# Patient Record
Sex: Male | Born: 1970 | Race: White | Hispanic: No | Marital: Married | Smoking: Former smoker
Health system: Southern US, Community
[De-identification: ages and names within clinical notes are randomized; demographics above are authoritative.]

## PROBLEM LIST (undated history)

## (undated) DIAGNOSIS — K5790 Diverticulosis of intestine, part unspecified, without perforation or abscess without bleeding: Secondary | ICD-10-CM

## (undated) DIAGNOSIS — K529 Noninfective gastroenteritis and colitis, unspecified: Secondary | ICD-10-CM

## (undated) DIAGNOSIS — N529 Male erectile dysfunction, unspecified: Secondary | ICD-10-CM

## (undated) DIAGNOSIS — S2239XA Fracture of one rib, unspecified side, initial encounter for closed fracture: Secondary | ICD-10-CM

## (undated) DIAGNOSIS — K760 Fatty (change of) liver, not elsewhere classified: Secondary | ICD-10-CM

## (undated) DIAGNOSIS — R739 Hyperglycemia, unspecified: Secondary | ICD-10-CM

## (undated) DIAGNOSIS — K2970 Gastritis, unspecified, without bleeding: Secondary | ICD-10-CM

## (undated) DIAGNOSIS — G4730 Sleep apnea, unspecified: Secondary | ICD-10-CM

## (undated) DIAGNOSIS — E785 Hyperlipidemia, unspecified: Secondary | ICD-10-CM

## (undated) DIAGNOSIS — E349 Endocrine disorder, unspecified: Secondary | ICD-10-CM

## (undated) DIAGNOSIS — K219 Gastro-esophageal reflux disease without esophagitis: Secondary | ICD-10-CM

## (undated) HISTORY — PX: KNEE ARTHROSCOPY: SHX127

## (undated) HISTORY — PX: ESOPHAGOGASTRODUODENOSCOPY: SHX1529

## (undated) HISTORY — PX: COLONOSCOPY: SHX174

## (undated) HISTORY — PX: HERNIA REPAIR: SHX51

---

## 2005-01-24 ENCOUNTER — Emergency Department: Payer: Self-pay | Admitting: Emergency Medicine

## 2009-12-30 ENCOUNTER — Ambulatory Visit: Payer: Self-pay

## 2014-06-14 ENCOUNTER — Ambulatory Visit: Payer: Self-pay | Admitting: Gastroenterology

## 2014-06-18 LAB — PATHOLOGY REPORT

## 2014-08-16 ENCOUNTER — Ambulatory Visit: Payer: Self-pay | Admitting: Gastroenterology

## 2014-09-10 ENCOUNTER — Ambulatory Visit: Payer: Self-pay | Admitting: Gastroenterology

## 2014-09-13 ENCOUNTER — Ambulatory Visit: Payer: Self-pay | Admitting: Gastroenterology

## 2015-01-20 LAB — SURGICAL PATHOLOGY

## 2015-09-30 DIAGNOSIS — K760 Fatty (change of) liver, not elsewhere classified: Secondary | ICD-10-CM | POA: Diagnosis not present

## 2015-09-30 DIAGNOSIS — K219 Gastro-esophageal reflux disease without esophagitis: Secondary | ICD-10-CM | POA: Diagnosis not present

## 2015-09-30 DIAGNOSIS — R739 Hyperglycemia, unspecified: Secondary | ICD-10-CM | POA: Diagnosis not present

## 2015-09-30 DIAGNOSIS — E781 Pure hyperglyceridemia: Secondary | ICD-10-CM | POA: Diagnosis not present

## 2015-10-24 DIAGNOSIS — Z125 Encounter for screening for malignant neoplasm of prostate: Secondary | ICD-10-CM | POA: Diagnosis not present

## 2015-10-24 DIAGNOSIS — E291 Testicular hypofunction: Secondary | ICD-10-CM | POA: Diagnosis not present

## 2015-10-24 DIAGNOSIS — R739 Hyperglycemia, unspecified: Secondary | ICD-10-CM | POA: Diagnosis not present

## 2015-10-30 DIAGNOSIS — E291 Testicular hypofunction: Secondary | ICD-10-CM | POA: Diagnosis not present

## 2015-10-30 DIAGNOSIS — K219 Gastro-esophageal reflux disease without esophagitis: Secondary | ICD-10-CM | POA: Diagnosis not present

## 2015-10-30 DIAGNOSIS — E781 Pure hyperglyceridemia: Secondary | ICD-10-CM | POA: Diagnosis not present

## 2015-10-30 DIAGNOSIS — R739 Hyperglycemia, unspecified: Secondary | ICD-10-CM | POA: Diagnosis not present

## 2015-11-12 DIAGNOSIS — E291 Testicular hypofunction: Secondary | ICD-10-CM | POA: Diagnosis not present

## 2016-01-19 DIAGNOSIS — E291 Testicular hypofunction: Secondary | ICD-10-CM | POA: Diagnosis not present

## 2016-01-19 DIAGNOSIS — Z6841 Body Mass Index (BMI) 40.0 and over, adult: Secondary | ICD-10-CM | POA: Diagnosis not present

## 2016-01-19 DIAGNOSIS — E781 Pure hyperglyceridemia: Secondary | ICD-10-CM | POA: Diagnosis not present

## 2016-01-19 DIAGNOSIS — R739 Hyperglycemia, unspecified: Secondary | ICD-10-CM | POA: Diagnosis not present

## 2016-01-27 DIAGNOSIS — E291 Testicular hypofunction: Secondary | ICD-10-CM | POA: Diagnosis not present

## 2016-01-27 DIAGNOSIS — R739 Hyperglycemia, unspecified: Secondary | ICD-10-CM | POA: Diagnosis not present

## 2016-01-27 DIAGNOSIS — E781 Pure hyperglyceridemia: Secondary | ICD-10-CM | POA: Diagnosis not present

## 2016-01-27 DIAGNOSIS — K760 Fatty (change of) liver, not elsewhere classified: Secondary | ICD-10-CM | POA: Diagnosis not present

## 2016-02-18 DIAGNOSIS — M25562 Pain in left knee: Secondary | ICD-10-CM | POA: Diagnosis not present

## 2016-02-18 DIAGNOSIS — M2392 Unspecified internal derangement of left knee: Secondary | ICD-10-CM | POA: Diagnosis not present

## 2016-03-15 ENCOUNTER — Other Ambulatory Visit: Payer: Self-pay | Admitting: Orthopedic Surgery

## 2016-03-15 DIAGNOSIS — M2392 Unspecified internal derangement of left knee: Secondary | ICD-10-CM

## 2016-03-15 DIAGNOSIS — M25562 Pain in left knee: Secondary | ICD-10-CM

## 2016-04-07 ENCOUNTER — Ambulatory Visit
Admission: RE | Admit: 2016-04-07 | Discharge: 2016-04-07 | Disposition: A | Discharge status: Home / Self Care | Payer: 59 | Source: Ambulatory Visit | Attending: Orthopedic Surgery | Admitting: Orthopedic Surgery

## 2016-04-07 DIAGNOSIS — M2392 Unspecified internal derangement of left knee: Secondary | ICD-10-CM

## 2016-04-07 DIAGNOSIS — M25562 Pain in left knee: Secondary | ICD-10-CM | POA: Diagnosis not present

## 2016-04-07 DIAGNOSIS — X58XXXA Exposure to other specified factors, initial encounter: Secondary | ICD-10-CM | POA: Diagnosis not present

## 2016-04-07 DIAGNOSIS — S83242A Other tear of medial meniscus, current injury, left knee, initial encounter: Secondary | ICD-10-CM | POA: Insufficient documentation

## 2016-04-07 DIAGNOSIS — M94262 Chondromalacia, left knee: Secondary | ICD-10-CM | POA: Insufficient documentation

## 2016-04-19 DIAGNOSIS — M1732 Unilateral post-traumatic osteoarthritis, left knee: Secondary | ICD-10-CM | POA: Diagnosis not present

## 2016-04-19 DIAGNOSIS — S83232A Complex tear of medial meniscus, current injury, left knee, initial encounter: Secondary | ICD-10-CM | POA: Diagnosis not present

## 2016-05-03 ENCOUNTER — Encounter
Admission: RE | Admit: 2016-05-03 | Discharge: 2016-05-03 | Disposition: A | Discharge status: Home / Self Care | Payer: 59 | Source: Ambulatory Visit | Attending: Surgery | Admitting: Surgery

## 2016-05-03 HISTORY — DX: Gastritis, unspecified, without bleeding: K29.70

## 2016-05-03 HISTORY — DX: Endocrine disorder, unspecified: E34.9

## 2016-05-03 HISTORY — DX: Diverticulosis of intestine, part unspecified, without perforation or abscess without bleeding: K57.90

## 2016-05-03 HISTORY — DX: Fracture of one rib, unspecified side, initial encounter for closed fracture: S22.39XA

## 2016-05-03 HISTORY — DX: Hyperglycemia, unspecified: R73.9

## 2016-05-03 HISTORY — DX: Noninfective gastroenteritis and colitis, unspecified: K52.9

## 2016-05-03 HISTORY — DX: Hyperlipidemia, unspecified: E78.5

## 2016-05-03 HISTORY — DX: Fatty (change of) liver, not elsewhere classified: K76.0

## 2016-05-03 HISTORY — DX: Gastro-esophageal reflux disease without esophagitis: K21.9

## 2016-05-03 HISTORY — DX: Male erectile dysfunction, unspecified: N52.9

## 2016-05-03 NOTE — Patient Instructions (Addendum)
  Your procedure is scheduled on: 05-11-16 Report to Same Day Surgery 2nd floor medical mall To find out your arrival time please call [phone] between 1PM - 3PM on 05-10-16  Remember: Instructions that are not followed completely may result in serious medical risk, up to and including death, or upon the discretion of your surgeon and anesthesiologist your surgery may need to be rescheduled.    _x___ 1. Do not eat food or drink liquids after midnight. No gum chewing or hard candies.     __x__ 2. No Alcohol for 24 hours before or after surgery.   __x__3. No Smoking for 24 prior to surgery.   ____  4. Bring all medications with you on the day of surgery if instructed.    __x__ 5. Notify your doctor if there is any change in your medical condition     (cold, fever, infections).     Do not wear jewelry, make-up, hairpins, clips or nail polish.  Do not wear lotions, powders, or perfumes. You may wear deodorant.  Do not shave 48 hours prior to surgery. Men may shave face and neck.  Do not bring valuables to the hospital.    [facility] is not responsible for any belongings or valuables.               Contacts, dentures or bridgework may not be worn into surgery.  Leave your suitcase in the car. After surgery it may be brought to your room.  For patients admitted to the hospital, discharge time is determined by your treatment team.   Patients discharged the day of surgery will not be allowed to drive home.    Please read over the following fact sheets that you were given:   [facility] Preparing for Surgery and or MRSA Information   _x___ Take these medicines the morning of surgery with A SIP OF WATER:    1. FENOFIBRATE  2.  3.  4.  5.  6.  ____ Fleet Enema (as directed)   ____ Use CHG Soap or sage wipes as directed on instruction sheet   ____ Use inhalers on the day of surgery and bring to hospital day of surgery  ____ Stop metformin 2 days prior to surgery    ____ Take  1/2 of usual insulin dose the night before surgery and none on the morning of  surgery.   ____ Stop aspirin or coumadin, or plavix  _x__ Stop Anti-inflammatories such as Advil, Aleve, Ibuprofen, Motrin, Naproxen,          Naprosyn, Goodies powders or aspirin products. Ok to take Tylenol.   ____ Stop supplements until after surgery.    ____ Bring C-Pap to the hospital.

## 2016-05-11 ENCOUNTER — Encounter: Payer: Self-pay | Admitting: Surgery

## 2016-05-11 ENCOUNTER — Ambulatory Visit: Payer: 59 | Admitting: Anesthesiology

## 2016-05-11 ENCOUNTER — Encounter
Admission: RE | Disposition: A | Discharge status: Home / Self Care | Payer: Self-pay | Source: Ambulatory Visit | Attending: Surgery

## 2016-05-11 ENCOUNTER — Ambulatory Visit
Admission: RE | Admit: 2016-05-11 | Discharge: 2016-05-11 | Disposition: A | Discharge status: Home / Self Care | Payer: 59 | Source: Ambulatory Visit | Attending: Surgery | Admitting: Surgery

## 2016-05-11 DIAGNOSIS — M1712 Unilateral primary osteoarthritis, left knee: Secondary | ICD-10-CM | POA: Diagnosis not present

## 2016-05-11 DIAGNOSIS — Z880 Allergy status to penicillin: Secondary | ICD-10-CM | POA: Insufficient documentation

## 2016-05-11 DIAGNOSIS — M1732 Unilateral post-traumatic osteoarthritis, left knee: Secondary | ICD-10-CM | POA: Diagnosis not present

## 2016-05-11 DIAGNOSIS — M23222 Derangement of posterior horn of medial meniscus due to old tear or injury, left knee: Secondary | ICD-10-CM | POA: Insufficient documentation

## 2016-05-11 DIAGNOSIS — M23252 Derangement of posterior horn of lateral meniscus due to old tear or injury, left knee: Secondary | ICD-10-CM | POA: Insufficient documentation

## 2016-05-11 DIAGNOSIS — S83242A Other tear of medial meniscus, current injury, left knee, initial encounter: Secondary | ICD-10-CM | POA: Diagnosis not present

## 2016-05-11 DIAGNOSIS — K219 Gastro-esophageal reflux disease without esophagitis: Secondary | ICD-10-CM | POA: Insufficient documentation

## 2016-05-11 DIAGNOSIS — Z888 Allergy status to other drugs, medicaments and biological substances status: Secondary | ICD-10-CM | POA: Diagnosis not present

## 2016-05-11 DIAGNOSIS — Z882 Allergy status to sulfonamides status: Secondary | ICD-10-CM | POA: Insufficient documentation

## 2016-05-11 DIAGNOSIS — Z87891 Personal history of nicotine dependence: Secondary | ICD-10-CM | POA: Insufficient documentation

## 2016-05-11 DIAGNOSIS — Z79899 Other long term (current) drug therapy: Secondary | ICD-10-CM | POA: Diagnosis not present

## 2016-05-11 DIAGNOSIS — M94262 Chondromalacia, left knee: Secondary | ICD-10-CM | POA: Diagnosis not present

## 2016-05-11 DIAGNOSIS — K5790 Diverticulosis of intestine, part unspecified, without perforation or abscess without bleeding: Secondary | ICD-10-CM | POA: Insufficient documentation

## 2016-05-11 DIAGNOSIS — E785 Hyperlipidemia, unspecified: Secondary | ICD-10-CM | POA: Diagnosis not present

## 2016-05-11 DIAGNOSIS — E669 Obesity, unspecified: Secondary | ICD-10-CM | POA: Diagnosis not present

## 2016-05-11 DIAGNOSIS — Z6841 Body Mass Index (BMI) 40.0 and over, adult: Secondary | ICD-10-CM | POA: Diagnosis not present

## 2016-05-11 DIAGNOSIS — S83282A Other tear of lateral meniscus, current injury, left knee, initial encounter: Secondary | ICD-10-CM | POA: Diagnosis not present

## 2016-05-11 DIAGNOSIS — M2242 Chondromalacia patellae, left knee: Secondary | ICD-10-CM | POA: Diagnosis not present

## 2016-05-11 DIAGNOSIS — R739 Hyperglycemia, unspecified: Secondary | ICD-10-CM | POA: Insufficient documentation

## 2016-05-11 DIAGNOSIS — M23304 Other meniscus derangements, unspecified medial meniscus, left knee: Secondary | ICD-10-CM | POA: Diagnosis not present

## 2016-05-11 HISTORY — PX: KNEE ARTHROSCOPY WITH MEDIAL MENISECTOMY: SHX5651

## 2016-05-11 SURGERY — ARTHROSCOPY, KNEE, WITH MEDIAL MENISCECTOMY
Anesthesia: General | Site: Knee | Laterality: Left | Wound class: Clean

## 2016-05-11 MED ORDER — FAMOTIDINE 20 MG PO TABS
20.0000 mg | ORAL_TABLET | Freq: Once | ORAL | Status: AC
  Administered 2016-05-11: 20 mg via ORAL

## 2016-05-11 MED ORDER — PROMETHAZINE HCL 25 MG/ML IJ SOLN: 6.2500 mg | INTRAMUSCULAR | Status: DC | PRN

## 2016-05-11 MED ORDER — ONDANSETRON HCL 4 MG PO TABS: 4.0000 mg | ORAL_TABLET | Freq: Four times a day (QID) | ORAL | Status: DC | PRN

## 2016-05-11 MED ORDER — CLINDAMYCIN PHOSPHATE 900 MG/50ML IV SOLN
INTRAVENOUS | Status: AC
  Filled 2016-05-11: qty 50

## 2016-05-11 MED ORDER — GLYCOPYRROLATE 0.2 MG/ML IJ SOLN
INTRAMUSCULAR | Status: DC | PRN
  Administered 2016-05-11: 0.2 mg via INTRAVENOUS

## 2016-05-11 MED ORDER — OXYCODONE HCL 5 MG PO TABS: 5.0000 mg | ORAL_TABLET | ORAL | 0 refills | Status: DC | PRN

## 2016-05-11 MED ORDER — OXYCODONE HCL 5 MG/5ML PO SOLN: 5.0000 mg | Freq: Once | ORAL | Status: DC | PRN

## 2016-05-11 MED ORDER — METOCLOPRAMIDE HCL 5 MG/ML IJ SOLN: 5.0000 mg | Freq: Three times a day (TID) | INTRAMUSCULAR | Status: DC | PRN

## 2016-05-11 MED ORDER — ONDANSETRON HCL 4 MG/2ML IJ SOLN
INTRAMUSCULAR | Status: DC | PRN
Start: 2016-05-11 — End: 2016-05-11
  Administered 2016-05-11: 4 mg via INTRAVENOUS

## 2016-05-11 MED ORDER — FENTANYL CITRATE (PF) 100 MCG/2ML IJ SOLN
INTRAMUSCULAR | Status: DC | PRN
  Administered 2016-05-11: 50 ug via INTRAVENOUS
  Administered 2016-05-11: 100 ug via INTRAVENOUS

## 2016-05-11 MED ORDER — BUPIVACAINE-EPINEPHRINE (PF) 0.5% -1:200000 IJ SOLN
INTRAMUSCULAR | Status: AC
  Filled 2016-05-11: qty 60

## 2016-05-11 MED ORDER — FENTANYL CITRATE (PF) 100 MCG/2ML IJ SOLN: 25.0000 ug | INTRAMUSCULAR | Status: DC | PRN

## 2016-05-11 MED ORDER — SUCCINYLCHOLINE CHLORIDE 20 MG/ML IJ SOLN
INTRAMUSCULAR | Status: DC | PRN
  Administered 2016-05-11: 120 mg via INTRAVENOUS

## 2016-05-11 MED ORDER — PROPOFOL 10 MG/ML IV BOLUS
INTRAVENOUS | Status: DC | PRN
  Administered 2016-05-11: 200 mg via INTRAVENOUS
  Administered 2016-05-11: 50 mg via INTRAVENOUS

## 2016-05-11 MED ORDER — OXYCODONE HCL 5 MG PO TABS: 5.0000 mg | ORAL_TABLET | Freq: Once | ORAL | Status: DC | PRN

## 2016-05-11 MED ORDER — BUPIVACAINE-EPINEPHRINE (PF) 0.5% -1:200000 IJ SOLN
INTRAMUSCULAR | Status: DC | PRN
  Administered 2016-05-11: 20 mL via PERINEURAL
  Administered 2016-05-11: 30 mL via PERINEURAL

## 2016-05-11 MED ORDER — METOCLOPRAMIDE HCL 10 MG PO TABS: 5.0000 mg | ORAL_TABLET | Freq: Three times a day (TID) | ORAL | Status: DC | PRN

## 2016-05-11 MED ORDER — PHENYLEPHRINE HCL 10 MG/ML IJ SOLN
INTRAMUSCULAR | Status: DC | PRN
  Administered 2016-05-11 (×4): 100 ug via INTRAVENOUS

## 2016-05-11 MED ORDER — LIDOCAINE HCL (PF) 2 % IJ SOLN
INTRAMUSCULAR | Status: DC | PRN
  Administered 2016-05-11: 50 mg

## 2016-05-11 MED ORDER — EPHEDRINE SULFATE 50 MG/ML IJ SOLN
INTRAMUSCULAR | Status: DC | PRN
  Administered 2016-05-11: 10 mg via INTRAVENOUS

## 2016-05-11 MED ORDER — CLINDAMYCIN PHOSPHATE 900 MG/50ML IV SOLN
900.0000 mg | Freq: Once | INTRAVENOUS | Status: AC
  Administered 2016-05-11: 900 mg via INTRAVENOUS

## 2016-05-11 MED ORDER — OXYCODONE HCL 5 MG PO TABS: 5.0000 mg | ORAL_TABLET | ORAL | Status: DC | PRN

## 2016-05-11 MED ORDER — FAMOTIDINE 20 MG PO TABS
ORAL_TABLET | ORAL | Status: AC
  Administered 2016-05-11: 20 mg via ORAL
  Filled 2016-05-11: qty 1

## 2016-05-11 MED ORDER — LACTATED RINGERS IV SOLN
INTRAVENOUS | Status: DC
  Administered 2016-05-11: 07:00:00 via INTRAVENOUS

## 2016-05-11 MED ORDER — ONDANSETRON HCL 4 MG/2ML IJ SOLN
4.0000 mg | Freq: Four times a day (QID) | INTRAMUSCULAR | Status: DC | PRN
Start: 2016-05-11 — End: 2016-05-11

## 2016-05-11 MED ORDER — LIDOCAINE HCL (PF) 1 % IJ SOLN
INTRAMUSCULAR | Status: AC
  Filled 2016-05-11: qty 30

## 2016-05-11 MED ORDER — MEPERIDINE HCL 25 MG/ML IJ SOLN: 6.2500 mg | INTRAMUSCULAR | Status: DC | PRN

## 2016-05-11 MED ORDER — POTASSIUM CHLORIDE IN NACL 20-0.9 MEQ/L-% IV SOLN: INTRAVENOUS | Status: DC

## 2016-05-11 MED ORDER — MIDAZOLAM HCL 5 MG/5ML IJ SOLN
INTRAMUSCULAR | Status: DC | PRN
  Administered 2016-05-11: 2 mg via INTRAVENOUS

## 2016-05-11 MED ORDER — LIDOCAINE HCL 1 % IJ SOLN
INTRAMUSCULAR | Status: DC | PRN
  Administered 2016-05-11: 30 mL

## 2016-05-11 SURGICAL SUPPLY — 33 items
BAG COUNTER SPONGE EZ (MISCELLANEOUS) IMPLANT
BANDAGE ACE 6X5 VEL STRL LF (GAUZE/BANDAGES/DRESSINGS) ×3 IMPLANT
BLADE FULL RADIUS 3.5 (BLADE) ×3 IMPLANT
BLADE SHAVER 4.5X7 STR FR (MISCELLANEOUS) IMPLANT
CHLORAPREP W/TINT 26ML (MISCELLANEOUS) ×3 IMPLANT
COUNTER SPONGE BAG EZ (MISCELLANEOUS)
CUFF TOURN 24 STER (MISCELLANEOUS) IMPLANT
CUFF TOURN 30 STER DUAL PORT (MISCELLANEOUS) ×3 IMPLANT
DRAPE IMP U-DRAPE 54X76 (DRAPES) ×3 IMPLANT
ELECT REM PT RETURN 9FT ADLT (ELECTROSURGICAL) ×3
ELECTRODE REM PT RTRN 9FT ADLT (ELECTROSURGICAL) ×1 IMPLANT
GAUZE SPONGE 4X4 12PLY STRL (GAUZE/BANDAGES/DRESSINGS) ×3 IMPLANT
GLOVE BIO SURGEON STRL SZ8 (GLOVE) ×6 IMPLANT
GLOVE BIOGEL M 7.0 STRL (GLOVE) ×6 IMPLANT
GLOVE BIOGEL PI IND STRL 7.5 (GLOVE) ×1 IMPLANT
GLOVE BIOGEL PI INDICATOR 7.5 (GLOVE) ×2
GLOVE INDICATOR 8.0 STRL GRN (GLOVE) ×3 IMPLANT
GOWN STRL REUS W/ TWL LRG LVL3 (GOWN DISPOSABLE) ×1 IMPLANT
GOWN STRL REUS W/ TWL XL LVL3 (GOWN DISPOSABLE) ×2 IMPLANT
GOWN STRL REUS W/TWL LRG LVL3 (GOWN DISPOSABLE) ×2
GOWN STRL REUS W/TWL XL LVL3 (GOWN DISPOSABLE) ×4
IV LACTATED RINGER IRRG 3000ML (IV SOLUTION) ×2
IV LR IRRIG 3000ML ARTHROMATIC (IV SOLUTION) ×1 IMPLANT
KIT RM TURNOVER STRD PROC AR (KITS) ×3 IMPLANT
MANIFOLD NEPTUNE II (INSTRUMENTS) ×3 IMPLANT
NEEDLE HYPO 21X1.5 SAFETY (NEEDLE) ×3 IMPLANT
PACK ARTHROSCOPY KNEE (MISCELLANEOUS) ×3 IMPLANT
PENCIL ELECTRO HAND CTR (MISCELLANEOUS) ×3 IMPLANT
SUT PROLENE 4 0 PS 2 18 (SUTURE) ×3 IMPLANT
SUT TI-CRON 2-0 W/10 SWGD (SUTURE) IMPLANT
SYR 50ML LL SCALE MARK (SYRINGE) ×3 IMPLANT
TUBING ARTHRO INFLOW-ONLY STRL (TUBING) ×3 IMPLANT
WAND HAND CNTRL MULTIVAC 90 (MISCELLANEOUS) ×3 IMPLANT

## 2016-05-11 NOTE — Anesthesia Preprocedure Evaluation (Addendum)
Anesthesia Evaluation  Patient identified by MRN, date of birth, ID band Patient awake    Reviewed: Allergy & Precautions, NPO status , Patient's Chart, lab work & pertinent test results  History of Anesthesia Complications Negative for: history of anesthetic complications  Airway Mallampati: III  TM Distance: >3 FB Neck ROM: Full    Dental no notable dental hx.    Pulmonary neg COPD, former smoker,    breath sounds clear to auscultation- rhonchi (-) wheezing      Cardiovascular Exercise Tolerance: Good hypertension (not currently on medication), (-) CAD and (-) Past MI  Rhythm:Regular Rate:Normal - Systolic murmurs and - Diastolic murmurs    Neuro/Psych negative neurological ROS     GI/Hepatic Neg liver ROS, GERD  ,  Endo/Other  negative endocrine ROSneg diabetes  Renal/GU negative Renal ROS     Musculoskeletal negative musculoskeletal ROS (+)   Abdominal (+) + obese,   Peds  Hematology negative hematology ROS (+)   Anesthesia Other Findings Past Medical History: No date: Colitis No date: Diverticulosis No date: Erectile dysfunction No date: Fatty liver No date: Gastritis No date: GERD (gastroesophageal reflux disease) No date: Hyperglycemia No date: Hyperlipidemia No date: Rib fracture No date: Testosterone deficiency   Reproductive/Obstetrics                             Anesthesia Physical Anesthesia Plan  ASA: II  Anesthesia Plan: General   Post-op Pain Management:    Induction: Intravenous  Airway Management Planned: Oral ETT  Additional Equipment:   Intra-op Plan:   Post-operative Plan: Extubation in OR  Informed Consent: I have reviewed the patients History and Physical, chart, labs and discussed the procedure including the risks, benefits and alternatives for the proposed anesthesia with the patient or authorized representative who has indicated his/her  understanding and acceptance.   Dental advisory given  Plan Discussed with: CRNA and Anesthesiologist  Anesthesia Plan Comments:         Anesthesia Quick Evaluation

## 2016-05-11 NOTE — Anesthesia Procedure Notes (Signed)
Procedure Name: Intubation Performed by: Rolla Plate Pre-anesthesia Checklist: Patient identified, Patient being monitored, Timeout performed, Emergency Drugs available and Suction available Patient Re-evaluated:Patient Re-evaluated prior to inductionOxygen Delivery Method: Circle system utilized Preoxygenation: Pre-oxygenation with 100% oxygen Intubation Type: IV induction and Rapid sequence Laryngoscope Size: 3 and Miller Grade View: Grade II Tube type: Oral Tube size: 7.5 mm Number of attempts: 1 Airway Equipment and Method: Stylet Placement Confirmation: ETT inserted through vocal cords under direct vision,  positive ETCO2 and breath sounds checked- equal and bilateral Secured at: 23 cm Tube secured with: Tape Dental Injury: Teeth and Oropharynx as per pre-operative assessment

## 2016-05-11 NOTE — Discharge Instructions (Addendum)
Keep dressing dry and intact.  May shower after dressing changed on post-op day #4 (Saturday).  Cover sutures with Band-Aids after drying off. Apply ice frequently to knee. May weight-bear as tolerated - use crutches or walker as needed. Follow-up in 10-14 days or as scheduled.    AMBULATORY SURGERY  DISCHARGE INSTRUCTIONS   1) The drugs that you were given will stay in your system until tomorrow so for the next 24 hours you should not:  A) Drive an automobile B) Make any legal decisions C) Drink any alcoholic beverage   2) You may resume regular meals tomorrow.  Today it is better to start with liquids and gradually work up to solid foods.  You may eat anything you prefer, but it is better to start with liquids, then soup and crackers, and gradually work up to solid foods.   3) Please notify your doctor immediately if you have any unusual bleeding, trouble breathing, redness and pain at the surgery site, drainage, fever, or pain not relieved by medication.    4) Additional Instructions:        Please contact your physician with any problems or Same Day Surgery at [phone], Monday through Friday 6 am to 4 pm, or  at [facility] number at [phone].

## 2016-05-11 NOTE — Anesthesia Postprocedure Evaluation (Signed)
Anesthesia Post Note  Patient: [name]  Procedure(s) Performed: Procedure(s) (LRB): KNEE ARTHROSCOPY WITH DEBRIDEMENT AND PARTIAL MEDIAL AND LATERL MENISECTOMY, ABRASION CHONDROPLASTY, FEMORAL TROCHLEA MEDIAL FEMORAL CHONDYL (Left)  Patient location during evaluation: PACU Anesthesia Type: General Level of consciousness: awake and alert and oriented Pain management: pain level controlled Vital Signs Assessment: post-procedure vital signs reviewed and stable Respiratory status: spontaneous breathing, nonlabored ventilation and respiratory function stable Cardiovascular status: blood pressure returned to baseline and stable Postop Assessment: no signs of nausea or vomiting Anesthetic complications: no    Last Vitals:  Vitals:   05/11/16 1003 05/11/16 1039  BP: (!) 163/62 (!) 164/81  Pulse: 80 80  Resp: 16 16  Temp: (!) 35.8 C (!) 35.8 C    Last Pain:  Vitals:   05/11/16 1039  TempSrc: Tympanic  PainSc:                  Yves Fodor

## 2016-05-11 NOTE — Transfer of Care (Signed)
Immediate Anesthesia Transfer of Care Note  Patient: HRIDHAAN KEIR  Procedure(s) Performed: Procedure(s): KNEE ARTHROSCOPY WITH DEBRIDEMENT AND PARTIAL MEDIAL AND LATERL MENISECTOMY, ABRASION CHONDROPLASTY, FEMORAL TROCHLEA MEDIAL FEMORAL CHONDYL (Left)  Patient Location: PACU  Anesthesia Type:General  Level of Consciousness: awake  Airway & Oxygen Therapy: Patient Spontanous Breathing and Patient connected to face mask oxygen  Post-op Assessment: Report given to RN and Post -op Vital signs reviewed and stable  Post vital signs: Reviewed  Last Vitals:  Vitals:   05/11/16 0641 05/11/16 0853  BP: (!) 152/98 (!) 167/93  Pulse: 85 86  Resp: 16 (!) 22  Temp: 36.9 C     Last Pain:  Vitals:   05/11/16 0641  TempSrc: Oral  PainSc: 6          Complications: No apparent anesthesia complications

## 2016-05-11 NOTE — H&P (Signed)
Paper H&P to be scanned into permanent record. H&P reviewed. No changes. 

## 2016-05-11 NOTE — Op Note (Signed)
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  Postoperative diagnosis:   Medial and lateral meniscal tears with early degenerative joint disease, left knee.  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  Surgeon:   Pascal Lux, M.D.  Asst.: Delma Post, PA-S  Anesthesia:   GET  Findings:   As above. There were grade 3 chondromalacial changes and a portion medial femoral condyle, as well as grade 3 chondromalacial changes involving the central portion of the femoral trochlea. There were grade 2-3 chondromalacial changes involving the central portion of the patella. The articular surfaces of the lateral compartment were in excellent condition, as were the anterior and posterior cruciate ligaments.  Complications:   None.  EBL:   10 cc.  Total fluids:   800 cc of crystalloid.  Tourniquet time:   None  Drains:   None  Closure:   4-0 Prolene interrupted sutures.  Brief clinical note:   The patient is a 44 year old male with a history of progressively worsening medial sided left knee pain and catching. His symptoms have persisted despite medications, activity modification, etc. His history and examination consistent with a medial meniscus tear. An MRI scan confirmed the presence of a medial meniscus tear but also to suggested some early degenerative joint disease involving the medial and patellofemoral compartments. The patient presents at this time for arthroscopy, debridement, and partial medial meniscectomy.  Procedure:   The patient was brought into the operating room and lain in the supine position. After adequate general laryngal mask anesthesia was obtained, a timeout was performed to verify the appropriate side. The patient's left knee was  injected sterilely using a solution of 30 cc of 1% lidocaine and 30 cc of 0.5% Sensorcaine with epinephrine. The left lower extremity was prepped with ChloraPrep solution before being draped sterilely. Preoperative antibiotics were administered. The expected portal sites were injected with 0.5% Sensorcaine with epinephrine before the camera was placed in the anterolateral portal and instrumentation performed through the anteromedial portal. The knee was sequentially examined beginning in the suprapatellar pouch, then progressing to the patellofemoral space, the medial gutter compartment, the notch, and finally the lateral compartment and gutter. The findings were as described above. Abundant reactive synovial tissues anteriorly were debrided using the full-radius resector in order to improve visualization. The area of degenerative change involving the posterior and posterior medial portions of the medial meniscus were debrided back to stable margins using the straight and curved baskets, as well as the full-radius resector. Some probing the remaining rim demonstrated excellent stability. Laterally, there was some degenerative tearing of the posterior horn/posterior root of the lateral meniscus which also was debrided back to stable margins using a combination of the straight baskets and full-radius resector. The areas of grade 3 chondromalacial changes involving the weightbearing portion of the medial femoral condyle as well as the femoral trochlea both were debrided back to stable margins using the full-radius resector. Hemostasis was achieved using the ArthroCare wand. The instruments were removed from the joint after suctioning the excess fluid. The portal sites were closed using 4-0 Prolene interrupted sutures before a sterile bulky dressing was applied to the knee. The patient was then awakened, extubated, and returned to the recovery room in satisfactory condition after tolerating the procedure well.

## 2016-06-09 DIAGNOSIS — L308 Other specified dermatitis: Secondary | ICD-10-CM | POA: Diagnosis not present

## 2016-06-09 DIAGNOSIS — L578 Other skin changes due to chronic exposure to nonionizing radiation: Secondary | ICD-10-CM | POA: Diagnosis not present

## 2017-08-24 DIAGNOSIS — C439 Malignant melanoma of skin, unspecified: Secondary | ICD-10-CM

## 2017-08-24 HISTORY — DX: Malignant melanoma of skin, unspecified: C43.9

## 2018-01-26 ENCOUNTER — Encounter: Payer: Self-pay | Admitting: Student

## 2018-01-27 ENCOUNTER — Ambulatory Visit
Admission: RE | Admit: 2018-01-27 | Discharge: 2018-01-27 | Disposition: A | Discharge status: Home / Self Care | Payer: BC Managed Care – PPO | Source: Ambulatory Visit | Attending: Gastroenterology | Admitting: Gastroenterology

## 2018-01-27 ENCOUNTER — Ambulatory Visit: Payer: BC Managed Care – PPO | Admitting: Certified Registered Nurse Anesthetist

## 2018-01-27 ENCOUNTER — Encounter
Admission: RE | Disposition: A | Discharge status: Home / Self Care | Payer: Self-pay | Source: Ambulatory Visit | Attending: Gastroenterology

## 2018-01-27 ENCOUNTER — Encounter: Payer: Self-pay | Admitting: Certified Registered Nurse Anesthetist

## 2018-01-27 DIAGNOSIS — K2950 Unspecified chronic gastritis without bleeding: Secondary | ICD-10-CM | POA: Insufficient documentation

## 2018-01-27 DIAGNOSIS — Z79899 Other long term (current) drug therapy: Secondary | ICD-10-CM | POA: Diagnosis not present

## 2018-01-27 DIAGNOSIS — K3189 Other diseases of stomach and duodenum: Secondary | ICD-10-CM | POA: Diagnosis not present

## 2018-01-27 DIAGNOSIS — Z79891 Long term (current) use of opiate analgesic: Secondary | ICD-10-CM | POA: Diagnosis not present

## 2018-01-27 DIAGNOSIS — D123 Benign neoplasm of transverse colon: Secondary | ICD-10-CM | POA: Diagnosis not present

## 2018-01-27 DIAGNOSIS — K529 Noninfective gastroenteritis and colitis, unspecified: Secondary | ICD-10-CM | POA: Insufficient documentation

## 2018-01-27 DIAGNOSIS — G4730 Sleep apnea, unspecified: Secondary | ICD-10-CM | POA: Insufficient documentation

## 2018-01-27 DIAGNOSIS — K2270 Barrett's esophagus without dysplasia: Secondary | ICD-10-CM | POA: Insufficient documentation

## 2018-01-27 DIAGNOSIS — E785 Hyperlipidemia, unspecified: Secondary | ICD-10-CM | POA: Insufficient documentation

## 2018-01-27 DIAGNOSIS — Z888 Allergy status to other drugs, medicaments and biological substances status: Secondary | ICD-10-CM | POA: Diagnosis not present

## 2018-01-27 DIAGNOSIS — Z880 Allergy status to penicillin: Secondary | ICD-10-CM | POA: Diagnosis not present

## 2018-01-27 DIAGNOSIS — K210 Gastro-esophageal reflux disease with esophagitis: Secondary | ICD-10-CM | POA: Diagnosis not present

## 2018-01-27 DIAGNOSIS — Z882 Allergy status to sulfonamides status: Secondary | ICD-10-CM | POA: Diagnosis not present

## 2018-01-27 DIAGNOSIS — K5790 Diverticulosis of intestine, part unspecified, without perforation or abscess without bleeding: Secondary | ICD-10-CM | POA: Diagnosis not present

## 2018-01-27 DIAGNOSIS — K621 Rectal polyp: Secondary | ICD-10-CM | POA: Diagnosis not present

## 2018-01-27 HISTORY — PX: ESOPHAGOGASTRODUODENOSCOPY (EGD) WITH PROPOFOL: SHX5813

## 2018-01-27 HISTORY — DX: Sleep apnea, unspecified: G47.30

## 2018-01-27 HISTORY — PX: COLONOSCOPY WITH PROPOFOL: SHX5780

## 2018-01-27 SURGERY — COLONOSCOPY WITH PROPOFOL
Anesthesia: General

## 2018-01-27 MED ORDER — SODIUM CHLORIDE 0.9 % IV SOLN
INTRAVENOUS | Status: DC
  Administered 2018-01-27: 1000 mL via INTRAVENOUS

## 2018-01-27 MED ORDER — LIDOCAINE HCL (CARDIAC) PF 100 MG/5ML IV SOSY
PREFILLED_SYRINGE | INTRAVENOUS | Status: DC | PRN
  Administered 2018-01-27: 50 mg via INTRAVENOUS

## 2018-01-27 MED ORDER — LIDOCAINE HCL (PF) 1 % IJ SOLN
INTRAMUSCULAR | Status: AC
  Administered 2018-01-27: 0.3 mL via INTRADERMAL
  Filled 2018-01-27: qty 2

## 2018-01-27 MED ORDER — PROPOFOL 10 MG/ML IV BOLUS
INTRAVENOUS | Status: DC | PRN
  Administered 2018-01-27: 100 mg via INTRAVENOUS

## 2018-01-27 MED ORDER — MIDAZOLAM HCL 2 MG/2ML IJ SOLN
INTRAMUSCULAR | Status: AC
  Filled 2018-01-27: qty 2

## 2018-01-27 MED ORDER — LIDOCAINE HCL (PF) 2 % IJ SOLN
INTRAMUSCULAR | Status: AC
  Filled 2018-01-27: qty 10

## 2018-01-27 MED ORDER — PROPOFOL 500 MG/50ML IV EMUL
INTRAVENOUS | Status: DC | PRN
  Administered 2018-01-27: 140 ug/kg/min via INTRAVENOUS

## 2018-01-27 MED ORDER — PROPOFOL 500 MG/50ML IV EMUL
INTRAVENOUS | Status: AC
  Filled 2018-01-27: qty 50

## 2018-01-27 MED ORDER — PROPOFOL 10 MG/ML IV BOLUS
INTRAVENOUS | Status: AC
  Filled 2018-01-27: qty 20

## 2018-01-27 MED ORDER — LIDOCAINE HCL (PF) 1 % IJ SOLN
2.0000 mL | Freq: Once | INTRAMUSCULAR | Status: AC
  Administered 2018-01-27: 0.3 mL via INTRADERMAL

## 2018-01-27 MED ORDER — MIDAZOLAM HCL 2 MG/2ML IJ SOLN
INTRAMUSCULAR | Status: DC | PRN
  Administered 2018-01-27: 2 mg via INTRAVENOUS

## 2018-01-27 NOTE — Anesthesia Preprocedure Evaluation (Signed)
Anesthesia Evaluation  Patient identified by MRN, date of birth, ID band Patient awake    Reviewed: Allergy & Precautions, H&P , NPO status , Patient's Chart, lab work & pertinent test results, reviewed documented beta blocker date and time   Airway Mallampati: II   Neck ROM: full    Dental  (+) Poor Dentition   Pulmonary neg pulmonary ROS, sleep apnea and Continuous Positive Airway Pressure Ventilation , former smoker,    Pulmonary exam normal        Cardiovascular Exercise Tolerance: Good negative cardio ROS Normal cardiovascular exam Rhythm:regular Rate:Normal     Neuro/Psych negative neurological ROS  negative psych ROS   GI/Hepatic negative GI ROS, Neg liver ROS, GERD  ,  Endo/Other  negative endocrine ROSMorbid obesity  Renal/GU negative Renal ROS  negative genitourinary   Musculoskeletal   Abdominal   Peds  Hematology negative hematology ROS (+)   Anesthesia Other Findings Past Medical History: No date: Colitis No date: Colitis No date: Diverticulosis No date: Diverticulosis No date: Erectile dysfunction No date: Fatty liver No date: Gastritis No date: GERD (gastroesophageal reflux disease) No date: Hyperglycemia No date: Hyperlipidemia No date: Rib fracture No date: Sleep apnea No date: Testosterone deficiency Past Surgical History: No date: COLONOSCOPY No date: ESOPHAGOGASTRODUODENOSCOPY No date: HERNIA REPAIR No date: KNEE ARTHROSCOPY 05/11/2016: KNEE ARTHROSCOPY WITH MEDIAL MENISECTOMY; Left     Comment:  Procedure: KNEE ARTHROSCOPY WITH DEBRIDEMENT AND PARTIAL              MEDIAL AND LATERL MENISECTOMY, ABRASION CHONDROPLASTY,               FEMORAL TROCHLEA MEDIAL FEMORAL CHONDYL;  Surgeon: Corky Mull, MD;  Location: [facility] ORS;  Service: Orthopedics;                Laterality: Left; BMI    Body Mass Index:  42.18 kg/m     Reproductive/Obstetrics negative OB ROS                              Anesthesia Physical Anesthesia Plan  ASA: III  Anesthesia Plan: General   Post-op Pain Management:    Induction:   PONV Risk Score and Plan:   Airway Management Planned:   Additional Equipment:   Intra-op Plan:   Post-operative Plan:   Informed Consent: I have reviewed the patients History and Physical, chart, labs and discussed the procedure including the risks, benefits and alternatives for the proposed anesthesia with the patient or authorized representative who has indicated his/her understanding and acceptance.   Dental Advisory Given  Plan Discussed with: CRNA  Anesthesia Plan Comments:         Anesthesia Quick Evaluation

## 2018-01-27 NOTE — Op Note (Signed)
[facility] Gastroenterology Patient Name: Brandon Caldwell Procedure Date: 01/27/2018 10:33 AM MRN: [id number] Account #: [id number] Date of Birth: 1971-03-05 Admit Type: Outpatient Age: 47 Room: [facility] ENDO ROOM 1 Gender: Male Note Status: Finalized Procedure:            Colonoscopy Indications:          Follow-up of colitis Providers:            Lollie Sails, MD Referring MD:         Ramonita Lab, MD (Referring MD) Medicines:            Monitored Anesthesia Care Complications:        No immediate complications. Procedure:            Pre-Anesthesia Assessment:                       - ASA Grade Assessment: III - A patient with severe                        systemic disease.                       After obtaining informed consent, the colonoscope was                        passed under direct vision. Throughout the procedure,                        the patient's blood pressure, pulse, and oxygen                        saturations were monitored continuously. The                        Colonoscope was introduced through the anus and                        advanced to the the terminal ileum. The colonoscopy was                        performed without difficulty. The patient tolerated the                        procedure well. The quality of the bowel preparation                        was good. Findings:      A few small-mouthed diverticula were found in the sigmoid colon,       descending colon, transverse colon and ascending colon.      Two sessile polyps were found in the distal transverse colon. The polyps       were 2 to 3 mm in size. These polyps were removed with a cold biopsy       forceps. Resection and retrieval were complete.      Biopsies for histology were taken with a cold forceps from the cecum,       ascending colon, transverse colon, descending colon, sigmoid colon and       rectum for evaluation of microscopic colitis.      A 1 mm polyp was  found in the transverse colon. The polyp was sessile.  The polyp was removed with a cold biopsy forceps. Resection and       retrieval were complete.      Two sessile polyps were found in the rectum. The polyps were 1 to 2 mm       in size. These polyps were removed with a cold biopsy forceps. Resection       and retrieval were complete.      No additional abnormalities were found on retroflexion.      The digital rectal exam was normal.      The terminal ileum appeared normal. Biopsies were taken with a cold       forceps for histology. Impression:           - Diverticulosis in the sigmoid colon, in the                        descending colon, in the transverse colon and in the                        ascending colon.                       - Two 2 to 3 mm polyps in the distal transverse colon,                        removed with a cold biopsy forceps. Resected and                        retrieved.                       - One 1 mm polyp in the transverse colon, removed with                        a cold biopsy forceps. Resected and retrieved.                       - Two 1 to 2 mm polyps in the rectum, removed with a                        cold biopsy forceps. Resected and retrieved.                       - Biopsies were taken with a cold forceps from the                        cecum, ascending colon, transverse colon, descending                        colon, sigmoid colon and rectum for evaluation of                        microscopic colitis. Recommendation:       - Discharge patient to home.                       - Soft diet for 1 day, then advance as tolerated to                        advance diet as tolerated.                       -  Await pathology results.                       - Telephone GI clinic for pathology results in 1 week. Procedure Code(s):    --- Professional ---                       [phone], Colonoscopy, flexible; with biopsy, single or                         multiple Diagnosis Code(s):    --- Professional ---                       D12.3, Benign neoplasm of transverse colon (hepatic                        flexure or splenic flexure)                       K62.1, Rectal polyp                       K52.9, Noninfective gastroenteritis and colitis,                        unspecified                       K57.30, Diverticulosis of large intestine without                        perforation or abscess without bleeding CPT copyright 2017 [employer]. All rights reserved. The codes documented in this report are preliminary and upon coder review may  be revised to meet current compliance requirements. Lollie Sails, MD 01/27/2018 11:36:51 AM This report has been signed electronically. Number of Addenda: 0 Note Initiated On: 01/27/2018 10:33 AM Scope Withdrawal Time: 0 hours 16 minutes 36 seconds  Total Procedure Duration: 0 hours 22 minutes 51 seconds       [facility]

## 2018-01-27 NOTE — H&P (Signed)
Outpatient short stay form Pre-procedure 01/27/2018 10:16 AM Brandon Sails MD  Primary Physician: Dr. Ramonita Lab  Reason for visit: EGD and colonoscopy  History of present illness: Patient is a 47 year old male presenting today as above.  He has a personal history of Barrett's esophagus as well as a history of colitis.  The colitis was felt to be nonspecific.  He denies any abdominal pain dyspepsia nausea vomiting or problems swallowing.  He has no bowel habit changes no melena or hematochezia unplanned weight loss is or other GI complaints.  She tolerated his prep well.  He takes no blood thinning agents or aspirin products.    Current Facility-Administered Medications:  .  0.9 %  sodium chloride infusion, , Intravenous, Continuous, Brandon Sails, MD .  lidocaine (PF) (XYLOCAINE) 1 % injection 2 mL, 2 mL, Intradermal, Once, Brandon Sails, MD  Medications Prior to Admission  Medication Sig Dispense Refill Last Dose  . Magnesium 250 MG TABS Take by mouth.     . Zinc 100 MG TABS Take by mouth.     Marland Kitchen acetaminophen (TYLENOL) 500 MG tablet Take 1,000 mg by mouth every 8 (eight) hours as needed.   Past Week at Unknown time  . azelastine (ASTELIN) 0.1 % nasal spray Place 2 sprays into both nostrils daily. Use in each nostril as directed   05/11/2016 at Unknown time  . fenofibrate (TRICOR) 145 MG tablet Take 145 mg by mouth every morning.   05/10/2016 at Unknown time  . fexofenadine (ALLEGRA) 180 MG tablet Take 180 mg by mouth every evening.   05/09/2016  . oxyCODONE (ROXICODONE) 5 MG immediate release tablet Take 1-2 tablets (5-10 mg total) by mouth every 4 (four) hours as needed for severe pain. 40 tablet 0   . testosterone cypionate (DEPOTESTOTERONE CYPIONATE) 100 MG/ML injection Inject 100 mg into the muscle every 7 (seven) days. For IM use only-ON SUNDAYS   05/09/2016     Allergies  Allergen Reactions  . Aciphex [Rabeprazole Sodium] Diarrhea  . Lipitor [Atorvastatin] Other (See  Comments)    MYALGIA  . Penicillins     Swelling   . Sulfa Antibiotics     Swelling and rash   . Viagra [Sildenafil Citrate] Other (See Comments)    headache  . Zolpidem Other (See Comments)    Blacked out     Past Medical History:  Diagnosis Date  . Colitis   . Colitis   . Diverticulosis   . Diverticulosis   . Erectile dysfunction   . Fatty liver   . Gastritis   . GERD (gastroesophageal reflux disease)   . Hyperglycemia   . Hyperlipidemia   . Rib fracture   . Sleep apnea   . Testosterone deficiency     Review of systems:      Physical Exam    Heart and lungs: Regular rate and rhythm without rub or gallop, lungs are bilaterally clear.    HEENT: Normocephalic atraumatic eyes are anicteric    Other:    Pertinant exam for procedure: Soft nontender nondistended bowel sounds are positive normoactive protuberant.    Planned proceedures: EGD and colonoscopy with indicated procedures. I have discussed the risks benefits and complications of procedures to include not limited to bleeding, infection, perforation and the risk of sedation and the patient wishes to proceed.    Brandon Sails, MD Gastroenterology 01/27/2018  10:16 AM

## 2018-01-27 NOTE — Transfer of Care (Signed)
Immediate Anesthesia Transfer of Care Note  Patient: Brandon Caldwell  Procedure(s) Performed: COLONOSCOPY WITH PROPOFOL (N/A ) ESOPHAGOGASTRODUODENOSCOPY (EGD) WITH PROPOFOL (N/A )  Patient Location: PACU and Endoscopy Unit  Anesthesia Type:General  Level of Consciousness: drowsy  Airway & Oxygen Therapy: Patient Spontanous Breathing and Patient connected to nasal cannula oxygen  Post-op Assessment: Report given to RN and Post -op Vital signs reviewed and stable  Post vital signs: Reviewed and stable  Last Vitals:  Vitals Value Taken Time  BP 90/42 01/27/2018 11:36 AM  Temp 36.2 C 01/27/2018 11:36 AM  Pulse 72 01/27/2018 11:36 AM  Resp 16 01/27/2018 11:36 AM  SpO2 92 % 01/27/2018 11:36 AM  Vitals shown include unvalidated device data.  Last Pain:  Vitals:   01/27/18 1136  TempSrc: Tympanic  PainSc: Asleep         Complications: No apparent anesthesia complications

## 2018-01-27 NOTE — Anesthesia Postprocedure Evaluation (Signed)
Anesthesia Post Note  Patient: Brandon Caldwell  Procedure(s) Performed: COLONOSCOPY WITH PROPOFOL (N/A ) ESOPHAGOGASTRODUODENOSCOPY (EGD) WITH PROPOFOL (N/A )  Patient location during evaluation: Endoscopy Anesthesia Type: General Level of consciousness: awake and alert and oriented Pain management: pain level controlled Vital Signs Assessment: post-procedure vital signs reviewed and stable Respiratory status: spontaneous breathing, nonlabored ventilation and respiratory function stable Cardiovascular status: blood pressure returned to baseline and stable Postop Assessment: no signs of nausea or vomiting Anesthetic complications: no     Last Vitals:  Vitals:   01/27/18 1156 01/27/18 1222  BP: (!) 119/55 136/77  Pulse:    Resp:  (!) 22  Temp:    SpO2:      Last Pain:  Vitals:   01/27/18 1222  TempSrc:   PainSc: 0-No pain                 Mackensi Mahadeo

## 2018-01-27 NOTE — Op Note (Addendum)
[facility] Gastroenterology Patient Name: Brandon Caldwell Procedure Date: 01/27/2018 10:33 AM MRN: [id number] Account #: [id number] Date of Birth: April 30, 1971 Admit Type: Outpatient Age: 47 Room: [facility] ENDO ROOM 1 Gender: Male Note Status: Finalized Procedure:            Upper GI endoscopy Indications:          Follow-up of Barrett's esophagus Providers:            Lollie Sails, MD Referring MD:         Ramonita Lab, MD (Referring MD) Medicines:            Monitored Anesthesia Care Complications:        No immediate complications. Procedure:            Pre-Anesthesia Assessment:                       - ASA Grade Assessment: III - A patient with severe                        systemic disease.                       After obtaining informed consent, the endoscope was                        passed under direct vision. Throughout the procedure,                        the patient's blood pressure, pulse, and oxygen                        saturations were monitored continuously. The Endoscope                        was introduced through the mouth, and advanced to the                        third part of duodenum. The upper GI endoscopy was                        accomplished without difficulty. The patient tolerated                        the procedure well. Findings:      There were esophageal mucosal changes secondary to established       short-segment Barrett's disease present at the gastroesophageal       junction. The maximum longitudinal extent of these mucosal changes was 2       cm in length. Mucosa was biopsied with a cold forceps for histology in a       targeted manner and in 4 quadrants at intervals of 1 cm at 41 and 42 cm       from the incisors. A total of 2 specimen bottles were sent to pathology.      Diffuse minimal inflammation characterized by erythema was found in the       gastric body. Biopsies were taken with a cold forceps for  histology.      The cardia and gastric fundus were initially seen as normal on       retroflexion however the photo was atypical and the  scope was       reintroduced . The upper body and fundus is normal in appearance.      Diffuse mild mucosal variance characterized by smoothness was found in       the entire duodenum. Biopsies were taken with a cold forceps for       histology. Impression:           - Esophageal mucosal changes secondary to established                        short-segment Barrett's disease. Biopsied.                       - Gastritis. Biopsied.                       - Mucosal variant in the duodenum. Biopsied. Recommendation:       - Continue present medications.                       - Return to GI clinic in 1 month.                       - Await pathology results. Procedure Code(s):    --- Professional ---                       [phone], Esophagogastroduodenoscopy, flexible, transoral;                        with biopsy, single or multiple Diagnosis Code(s):    --- Professional ---                       K22.70, Barrett's esophagus without dysplasia                       K29.70, Gastritis, unspecified, without bleeding                       K31.89, Other diseases of stomach and duodenum CPT copyright 2017 American Medical Association. All rights reserved. The codes documented in this report are preliminary and upon coder review may  be revised to meet current compliance requirements. Lollie Sails, MD 01/27/2018 11:05:11 AM This report has been signed electronically. Number of Addenda: 0 Note Initiated On: 01/27/2018 10:33 AM      [facility]

## 2018-01-27 NOTE — Anesthesia Post-op Follow-up Note (Signed)
Anesthesia QCDR form completed.        

## 2018-01-30 ENCOUNTER — Encounter: Payer: Self-pay | Admitting: Gastroenterology

## 2018-01-30 LAB — SURGICAL PATHOLOGY

## 2018-05-24 ENCOUNTER — Other Ambulatory Visit: Payer: Self-pay | Admitting: Internal Medicine

## 2018-05-24 DIAGNOSIS — R748 Abnormal levels of other serum enzymes: Secondary | ICD-10-CM

## 2018-05-31 ENCOUNTER — Ambulatory Visit
Admission: RE | Admit: 2018-05-31 | Discharge: 2018-05-31 | Disposition: A | Discharge status: Home / Self Care | Payer: BC Managed Care – PPO | Source: Ambulatory Visit | Attending: Internal Medicine | Admitting: Internal Medicine

## 2018-05-31 ENCOUNTER — Encounter (INDEPENDENT_AMBULATORY_CARE_PROVIDER_SITE_OTHER): Payer: Self-pay

## 2018-05-31 DIAGNOSIS — K760 Fatty (change of) liver, not elsewhere classified: Secondary | ICD-10-CM | POA: Diagnosis not present

## 2018-05-31 DIAGNOSIS — R748 Abnormal levels of other serum enzymes: Secondary | ICD-10-CM | POA: Insufficient documentation

## 2019-01-28 ENCOUNTER — Encounter: Payer: Self-pay | Admitting: Emergency Medicine

## 2019-01-28 ENCOUNTER — Emergency Department: Payer: BC Managed Care – PPO

## 2019-01-28 ENCOUNTER — Other Ambulatory Visit: Payer: Self-pay

## 2019-01-28 ENCOUNTER — Observation Stay
Admission: EM | Admit: 2019-01-28 | Discharge: 2019-01-30 | Disposition: A | Discharge status: Home / Self Care | Payer: BC Managed Care – PPO | Attending: Internal Medicine | Admitting: Internal Medicine

## 2019-01-28 DIAGNOSIS — Z888 Allergy status to other drugs, medicaments and biological substances status: Secondary | ICD-10-CM | POA: Insufficient documentation

## 2019-01-28 DIAGNOSIS — J90 Pleural effusion, not elsewhere classified: Secondary | ICD-10-CM | POA: Diagnosis not present

## 2019-01-28 DIAGNOSIS — Z87891 Personal history of nicotine dependence: Secondary | ICD-10-CM | POA: Diagnosis not present

## 2019-01-28 DIAGNOSIS — F329 Major depressive disorder, single episode, unspecified: Secondary | ICD-10-CM | POA: Diagnosis not present

## 2019-01-28 DIAGNOSIS — G4730 Sleep apnea, unspecified: Secondary | ICD-10-CM | POA: Insufficient documentation

## 2019-01-28 DIAGNOSIS — Z881 Allergy status to other antibiotic agents status: Secondary | ICD-10-CM | POA: Insufficient documentation

## 2019-01-28 DIAGNOSIS — R079 Chest pain, unspecified: Secondary | ICD-10-CM

## 2019-01-28 DIAGNOSIS — R918 Other nonspecific abnormal finding of lung field: Secondary | ICD-10-CM | POA: Diagnosis not present

## 2019-01-28 DIAGNOSIS — Z882 Allergy status to sulfonamides status: Secondary | ICD-10-CM | POA: Diagnosis not present

## 2019-01-28 DIAGNOSIS — Z20828 Contact with and (suspected) exposure to other viral communicable diseases: Secondary | ICD-10-CM | POA: Insufficient documentation

## 2019-01-28 DIAGNOSIS — E785 Hyperlipidemia, unspecified: Secondary | ICD-10-CM | POA: Insufficient documentation

## 2019-01-28 DIAGNOSIS — E669 Obesity, unspecified: Secondary | ICD-10-CM | POA: Insufficient documentation

## 2019-01-28 DIAGNOSIS — Z79899 Other long term (current) drug therapy: Secondary | ICD-10-CM | POA: Diagnosis not present

## 2019-01-28 DIAGNOSIS — Z880 Allergy status to penicillin: Secondary | ICD-10-CM | POA: Insufficient documentation

## 2019-01-28 DIAGNOSIS — J189 Pneumonia, unspecified organism: Secondary | ICD-10-CM | POA: Diagnosis not present

## 2019-01-28 DIAGNOSIS — K219 Gastro-esophageal reflux disease without esophagitis: Secondary | ICD-10-CM | POA: Insufficient documentation

## 2019-01-28 DIAGNOSIS — J181 Lobar pneumonia, unspecified organism: Secondary | ICD-10-CM | POA: Diagnosis present

## 2019-01-28 DIAGNOSIS — A419 Sepsis, unspecified organism: Secondary | ICD-10-CM | POA: Insufficient documentation

## 2019-01-28 DIAGNOSIS — Z6837 Body mass index (BMI) 37.0-37.9, adult: Secondary | ICD-10-CM | POA: Diagnosis not present

## 2019-01-28 DIAGNOSIS — K760 Fatty (change of) liver, not elsewhere classified: Secondary | ICD-10-CM | POA: Insufficient documentation

## 2019-01-28 LAB — CBC
HCT: 47.3 % (ref 39.0–52.0)
Hemoglobin: 15.9 g/dL (ref 13.0–17.0)
MCH: 30.9 pg (ref 26.0–34.0)
MCHC: 33.6 g/dL (ref 30.0–36.0)
MCV: 92 fL (ref 80.0–100.0)
Platelets: 269 10*3/uL (ref 150–400)
RBC: 5.14 MIL/uL (ref 4.22–5.81)
RDW: 14 % (ref 11.5–15.5)
WBC: 10 10*3/uL (ref 4.0–10.5)
nRBC: 0 % (ref 0.0–0.2)

## 2019-01-28 LAB — BASIC METABOLIC PANEL
Anion gap: 10 (ref 5–15)
BUN: 14 mg/dL (ref 6–20)
CO2: 29 mmol/L (ref 22–32)
Calcium: 9.2 mg/dL (ref 8.9–10.3)
Chloride: 99 mmol/L (ref 98–111)
Creatinine, Ser: 0.92 mg/dL (ref 0.61–1.24)
GFR calc Af Amer: >60 mL/min (ref >60)
GFR calc non Af Amer: >60 mL/min (ref >60)
Glucose, Bld: 110 mg/dL — ABNORMAL HIGH (ref 70–99)
Potassium: 3.5 mmol/L (ref 3.5–5.1)
Sodium: 138 mmol/L (ref 135–145)

## 2019-01-28 LAB — TROPONIN I: Troponin I: <0.03 ng/mL (ref <0.03)

## 2019-01-28 MED ORDER — SODIUM CHLORIDE 0.9% FLUSH: 3.0000 mL | Freq: Once | INTRAVENOUS | Status: DC

## 2019-01-28 MED ORDER — LEVOFLOXACIN IN D5W 750 MG/150ML IV SOLN
750.0000 mg | Freq: Once | INTRAVENOUS | Status: AC
  Administered 2019-01-29: 750 mg via INTRAVENOUS
  Filled 2019-01-28: qty 150

## 2019-01-28 MED ORDER — FAMOTIDINE IN NACL 20-0.9 MG/50ML-% IV SOLN
20.0000 mg | Freq: Once | INTRAVENOUS | Status: AC
  Administered 2019-01-28: 20 mg via INTRAVENOUS
  Filled 2019-01-28: qty 50

## 2019-01-28 MED ORDER — METOCLOPRAMIDE HCL 5 MG/ML IJ SOLN
10.0000 mg | Freq: Once | INTRAMUSCULAR | Status: AC
  Administered 2019-01-28: 10 mg via INTRAVENOUS
  Filled 2019-01-28: qty 2

## 2019-01-28 MED ORDER — IOHEXOL 350 MG/ML SOLN
75.0000 mL | Freq: Once | INTRAVENOUS | Status: AC | PRN
  Administered 2019-01-28: 75 mL via INTRAVENOUS

## 2019-01-28 NOTE — ED Provider Notes (Signed)
[facility] Emergency Department Provider Note  ____________________________________________  Time seen: Approximately 11:56 PM  I have reviewed the triage vital signs and the nursing notes.   HISTORY  Chief Complaint Chest Pain    HPI Brandon Caldwell is a 48 y.o. male with a history of diverticulosis, hyperlipidemia, GERD who reports waking up from a nap at about 6:30 PM with left-sided chest pain radiating to the back and neck.  Worse with deep breathing.  Associated with some shortness of breath.  No cough.  Denies body aches fevers chills or sweats.  Denies any sick contacts or high risk for COVID exposure.  Symptoms are constant, severe.  Not exertional.  Denies any recent travel trauma hospitalization surgery or history of DVT or PE.      Past Medical History:  Diagnosis Date  . Colitis   . Colitis   . Diverticulosis   . Diverticulosis   . Erectile dysfunction   . Fatty liver   . Gastritis   . GERD (gastroesophageal reflux disease)   . Hyperglycemia   . Hyperlipidemia   . Rib fracture   . Sleep apnea   . Testosterone deficiency      There are no active problems to display for this patient.    Past Surgical History:  Procedure Laterality Date  . COLONOSCOPY    . COLONOSCOPY WITH PROPOFOL N/A 01/27/2018   Procedure: COLONOSCOPY WITH PROPOFOL;  Surgeon: Lollie Sails, MD;  Location: [facility] ENDOSCOPY;  Service: Endoscopy;  Laterality: N/A;  . ESOPHAGOGASTRODUODENOSCOPY    . ESOPHAGOGASTRODUODENOSCOPY (EGD) WITH PROPOFOL N/A 01/27/2018   Procedure: ESOPHAGOGASTRODUODENOSCOPY (EGD) WITH PROPOFOL;  Surgeon: Lollie Sails, MD;  Location: [facility] ENDOSCOPY;  Service: Endoscopy;  Laterality: N/A;  . HERNIA REPAIR    . KNEE ARTHROSCOPY    . KNEE ARTHROSCOPY WITH MEDIAL MENISECTOMY Left 05/11/2016   Procedure: KNEE ARTHROSCOPY WITH DEBRIDEMENT AND PARTIAL MEDIAL AND LATERL MENISECTOMY, ABRASION CHONDROPLASTY, FEMORAL TROCHLEA MEDIAL  FEMORAL CHONDYL;  Surgeon: Corky Mull, MD;  Location: [facility] ORS;  Service: Orthopedics;  Laterality: Left;     Prior to Admission medications   Medication Sig Start Date End Date Taking? Authorizing Provider  omeprazole (PRILOSEC) 10 MG capsule Take 10 mg by mouth daily.   Yes [provider]  testosterone cypionate (DEPOTESTOTERONE CYPIONATE) 100 MG/ML injection Inject 100 mg into the muscle every 7 (seven) days. For IM use only-ON SUNDAYS   Yes [provider]  acetaminophen (TYLENOL) 500 MG tablet Take 1,000 mg by mouth every 8 (eight) hours as needed.    [provider]  azelastine (ASTELIN) 0.1 % nasal spray Place 2 sprays into both nostrils daily. Use in each nostril as directed    [provider]  fenofibrate (TRICOR) 145 MG tablet Take 145 mg by mouth every morning.    [provider]  fexofenadine (ALLEGRA) 180 MG tablet Take 180 mg by mouth every evening.    [provider]  Magnesium 250 MG TABS Take by mouth.    [provider]  oxyCODONE (ROXICODONE) 5 MG immediate release tablet Take 1-2 tablets (5-10 mg total) by mouth every 4 (four) hours as needed for severe pain. 05/11/16   Poggi, Marshall Cork, MD  Zinc 100 MG TABS Take by mouth.    [provider]     Allergies Aciphex [rabeprazole sodium]; Lipitor [atorvastatin]; Penicillins; Sulfa antibiotics; Viagra [sildenafil citrate]; and Zolpidem   History reviewed. No pertinent family history.  Social History Social History  Tobacco Use  . Smoking status: Former Smoker    Packs/day: 1.00    Years: 15.00    Pack years: 15.00    Types: Cigarettes    Last attempt to quit: 05/03/2006    Years since quitting: 12.7  . Smokeless tobacco: Never Used  Substance Use Topics  . Alcohol use: No    Comment: OCC  . Drug use: No    Review of Systems  Constitutional:   No fever or chills.  ENT:   No sore throat. No rhinorrhea. Cardiovascular: Positive as above  chest pain without syncope. Respiratory: Positive shortness of breath without cough. Gastrointestinal:   Negative for abdominal pain, vomiting and diarrhea.  Musculoskeletal:   Negative for focal pain or swelling All other systems reviewed and are negative except as documented above in ROS and HPI.  ____________________________________________   PHYSICAL EXAM:  VITAL SIGNS: ED Triage Vitals  Enc Vitals Group     BP 01/28/19 2038 (!) 149/83     Pulse Rate 01/28/19 2038 93     Resp 01/28/19 2038 (!) 28     Temp 01/28/19 2038 98.2 F (36.8 C)     Temp Source 01/28/19 2038 Oral     SpO2 01/28/19 2038 98 %     Weight 01/28/19 2042 265 lb (120.2 kg)     Height 01/28/19 2042 6' (1.829 m)     Head Circumference --      Peak Flow --      Pain Score 01/28/19 2042 10     Pain Loc --      Pain Edu? --      Excl. in [place]? --     Vital signs reviewed, nursing assessments reviewed.   Constitutional:   Alert and oriented. Non-toxic appearance. Eyes:   Conjunctivae are normal. EOMI. PERRL. ENT      Head:   Normocephalic and atraumatic.      Nose:   No congestion/rhinnorhea.       Mouth/Throat:   MMM, no pharyngeal erythema. No peritonsillar mass.       Neck:   No meningismus. Full ROM. Hematological/Lymphatic/Immunilogical:   No cervical lymphadenopathy. Cardiovascular:   RRR. Symmetric bilateral radial and DP pulses.  No murmurs. Cap refill less than 2 seconds. Respiratory: Tachypnea with normal work of breathing.  Lungs clear to auscultation bilaterally, no wheezes or crackles.. Gastrointestinal:   Soft and nontender. Non distended. There is no CVA tenderness.  No rebound, rigidity, or guarding.  Musculoskeletal:   Normal range of motion in all extremities. No joint effusions.  No lower extremity tenderness.  No edema. Neurologic:   Normal speech and language.  Motor grossly intact. No acute focal neurologic deficits are appreciated.  Skin:    Skin is warm, dry and intact. No rash  noted.  No petechiae, purpura, or bullae.  ____________________________________________    LABS (pertinent positives/negatives) (all labs ordered are listed, but only abnormal results are displayed) Labs Reviewed  BASIC METABOLIC PANEL - Abnormal; Notable for the following components:      Result Value   Glucose, Bld 110 (*)    All other components within normal limits  SARS CORONAVIRUS 2 (HOSPITAL ORDER, [place] [facility])  CBC  TROPONIN I   ____________________________________________   EKG  Interpreted by me Sinus rhythm rate of 91, left axis, normal intervals.  Normal QRS ST segments and T waves.  ____________________________________________    RADIOLOGY  Dg Chest 2 View  Result Date: 01/28/2019 CLINICAL  DATA:  Awoke from a nap with LEFT chest pain, sharp pain radiating to LEFT arm, history of GERD EXAM: CHEST - 2 VIEW COMPARISON:  01/24/2005 FINDINGS: Upper normal heart size. Mediastinal contours and pulmonary vascularity normal. Decreased lung volumes with bibasilar atelectasis. Lungs otherwise clear. No infiltrate, pleural effusion or pneumothorax. Old appearing fracture of the posterior RIGHT seventh rib. IMPRESSION: Bibasilar atelectasis. Electronically Signed   By: Lavonia Dana M.D.   On: 01/28/2019 21:30   Ct Angio Chest Pe W And/or Wo Contrast  Result Date: 01/28/2019 CLINICAL DATA:  Right chest pain. EXAM: CT ANGIOGRAPHY CHEST WITH CONTRAST TECHNIQUE: Multidetector CT imaging of the chest was performed using the standard protocol during bolus administration of intravenous contrast. Multiplanar CT image reconstructions and MIPs were obtained to evaluate the vascular anatomy. CONTRAST:  143 mL OMNIPAQUE IOHEXOL 350 MG/ML SOLN COMPARISON:  None. FINDINGS: Cardiovascular: Image quality is decreased due to respiratory motion. The patient was unable to hold breath. No visible pulmonary embolus. The vessels in the lower lobes are obscured by respiratory  motion. Heart is borderline in size. Aorta normal caliber. Mediastinum/Nodes: No mediastinal, hilar, or axillary adenopathy. Lungs/Pleura: Left lower lobe airspace disease concerning for pneumonia. Small left pleural effusion. Lingular atelectasis or infiltrate also present at the left lung base. No focal opacity on the right. Upper Abdomen: Diffuse low-density throughout the liver compatible with fatty infiltration. No acute findings Musculoskeletal: Chest wall soft tissues are unremarkable. Review of the MIP images confirms the above findings. IMPRESSION: Decreased image quality due to respiratory motion. No visible pulmonary embolus. Left lower lobe and lingular airspace disease at the left lung base concerning for pneumonia. Small left pleural effusion. Fatty infiltration of the liver. Electronically Signed   By: Rolm Baptise M.D.   On: 01/28/2019 23:00    ____________________________________________   PROCEDURES Procedures  ____________________________________________  DIFFERENTIAL DIAGNOSIS   Non-STEMI, pneumonia, pneumothorax, pleural effusion, pulmonary edema, PE, less likely aortic dissection  CLINICAL IMPRESSION / ASSESSMENT AND PLAN / ED COURSE  Medications ordered in the ED: Medications  sodium chloride flush (NS) 0.9 % injection 3 mL (has no administration in time range)  levofloxacin (LEVAQUIN) IVPB 750 mg (has no administration in time range)  metoCLOPramide (REGLAN) injection 10 mg (10 mg Intravenous Given 01/28/19 2154)  famotidine (PEPCID) IVPB 20 mg premix ( Intravenous Stopped 01/28/19 2224)  iohexol (OMNIPAQUE) 350 MG/ML injection 75 mL (75 mLs Intravenous Contrast Given 01/28/19 2228)    Pertinent labs & imaging results that were available during my care of the patient were reviewed by me and considered in my medical decision making (see chart for details).  Brandon Caldwell was evaluated in Emergency Department on 01/28/2019 for the symptoms described in the history of  present illness. He was evaluated in the context of the global COVID-19 pandemic, which necessitated consideration that the patient might be at risk for infection with the SARS-CoV-2 virus that causes COVID-19. Institutional protocols and algorithms that pertain to the evaluation of patients at risk for COVID-19 are in a state of rapid change based on information released by regulatory bodies including the CDC and federal and state organizations. These policies and algorithms were followed during the patient's care in the ED.   Patient presents with atypical chest pain, concerning for primarily a pulmonary cause.  Initial labs EKG and chest x-ray all unremarkable, necessitating CT angiogram to evaluate for PE.  GI cocktail to see if this helps with his symptoms.  Clinical Course as of May 03  [place] Jan 28, 2019  2316 CT scan shows multi lobar infiltrate consistent with pneumonia, with associated small pleural effusion.  He is tachypneic and tachycardic.  Not septic, but I will start IV Levaquin and admit for further management.   [PS]    Clinical Course User Index [PS] Carrie Mew, MD    ----------------------------------------- 11:58 PM on 01/28/2019 -----------------------------------------  Case discussed with hospitalist.  COVID screening test ordered.  Patient agrees with hospitalization plan   ____________________________________________   FINAL CLINICAL IMPRESSION(S) / ED DIAGNOSES    Final diagnoses:  Community acquired pneumonia of left lower lobe of lung ([place])  Nonspecific chest pain     ED Discharge Orders    None      Portions of this note were generated with dragon dictation software. Dictation errors may occur despite best attempts at proofreading.   Carrie Mew, MD 01/28/19 [phone]

## 2019-01-28 NOTE — ED Triage Notes (Signed)
Pt states chest pain right side up into neck that started around 630pm. No cardiac hx

## 2019-01-28 NOTE — ED Notes (Signed)
Patient transported to X-ray 

## 2019-01-29 ENCOUNTER — Other Ambulatory Visit: Payer: Self-pay

## 2019-01-29 DIAGNOSIS — A419 Sepsis, unspecified organism: Secondary | ICD-10-CM | POA: Diagnosis present

## 2019-01-29 LAB — LACTIC ACID, PLASMA
Lactic Acid, Venous: 1.1 mmol/L (ref 0.5–1.9)
Lactic Acid, Venous: 1 mmol/L (ref 0.5–1.9)

## 2019-01-29 LAB — RESPIRATORY PANEL BY PCR

## 2019-01-29 LAB — SARS CORONAVIRUS 2 BY RT PCR (HOSPITAL ORDER, PERFORMED IN ~~LOC~~ HOSPITAL LAB): SARS Coronavirus 2: NEGATIVE

## 2019-01-29 LAB — HEMOGLOBIN A1C
Hgb A1c MFr Bld: 5.7 % — ABNORMAL HIGH (ref 4.8–5.6)
Mean Plasma Glucose: 116.89 mg/dL

## 2019-01-29 LAB — TROPONIN I: Troponin I: <0.03 ng/mL (ref <0.03)

## 2019-01-29 LAB — TSH: TSH: 2.199 u[IU]/mL (ref 0.350–4.500)

## 2019-01-29 MED ORDER — ACETAMINOPHEN 650 MG RE SUPP: 650.0000 mg | Freq: Four times a day (QID) | RECTAL | Status: DC | PRN

## 2019-01-29 MED ORDER — ONDANSETRON HCL 4 MG/2ML IJ SOLN: 4.0000 mg | Freq: Four times a day (QID) | INTRAMUSCULAR | Status: DC | PRN

## 2019-01-29 MED ORDER — ACETAMINOPHEN 325 MG PO TABS
650.0000 mg | ORAL_TABLET | Freq: Four times a day (QID) | ORAL | Status: DC | PRN
  Administered 2019-01-29: 17:00:00 650 mg via ORAL
  Filled 2019-01-29: qty 2

## 2019-01-29 MED ORDER — PANTOPRAZOLE SODIUM 40 MG PO TBEC
40.0000 mg | DELAYED_RELEASE_TABLET | Freq: Every day | ORAL | Status: DC
  Administered 2019-01-29 – 2019-01-30 (×2): 40 mg via ORAL
  Filled 2019-01-29 (×2): qty 1

## 2019-01-29 MED ORDER — FENOFIBRATE 160 MG PO TABS
160.0000 mg | ORAL_TABLET | Freq: Every day | ORAL | Status: DC
  Administered 2019-01-29 – 2019-01-30 (×2): 160 mg via ORAL
  Filled 2019-01-29 (×2): qty 1

## 2019-01-29 MED ORDER — SERTRALINE HCL 50 MG PO TABS
50.0000 mg | ORAL_TABLET | Freq: Every day | ORAL | Status: DC
  Administered 2019-01-29 – 2019-01-30 (×2): 50 mg via ORAL
  Filled 2019-01-29 (×2): qty 1

## 2019-01-29 MED ORDER — SODIUM CHLORIDE 0.9 % IV SOLN
INTRAVENOUS | Status: DC
  Administered 2019-01-29: 05:00:00 via INTRAVENOUS

## 2019-01-29 MED ORDER — ENOXAPARIN SODIUM 40 MG/0.4ML ~~LOC~~ SOLN
40.0000 mg | SUBCUTANEOUS | Status: DC
  Administered 2019-01-29 – 2019-01-30 (×2): 40 mg via SUBCUTANEOUS
  Filled 2019-01-29 (×2): qty 0.4

## 2019-01-29 MED ORDER — LEVOFLOXACIN IN D5W 750 MG/150ML IV SOLN
750.0000 mg | INTRAVENOUS | Status: DC
  Administered 2019-01-29: 750 mg via INTRAVENOUS
  Filled 2019-01-29 (×2): qty 150

## 2019-01-29 MED ORDER — ONDANSETRON HCL 4 MG PO TABS: 4.0000 mg | ORAL_TABLET | Freq: Four times a day (QID) | ORAL | Status: DC | PRN

## 2019-01-29 MED ORDER — SODIUM CHLORIDE 0.9 % IV BOLUS
500.0000 mL | Freq: Once | INTRAVENOUS | Status: AC
  Administered 2019-01-29: 06:00:00 500 mL via INTRAVENOUS

## 2019-01-29 MED ORDER — DOCUSATE SODIUM 100 MG PO CAPS
100.0000 mg | ORAL_CAPSULE | Freq: Two times a day (BID) | ORAL | Status: DC
  Administered 2019-01-29 – 2019-01-30 (×3): 100 mg via ORAL
  Filled 2019-01-29 (×3): qty 1

## 2019-01-29 NOTE — ED Notes (Signed)
ED TO INPATIENT HANDOFF REPORT  ED Nurse Name and Phone #: Wells Guiles [phone]  S Name/Age/Gender Brandon Caldwell 48 y.o. male Room/Bed: ED15A/ED15A  Code Status   Code Status: Prior  Home/SNF/Other Home Patient oriented to: self, place, time and situation Is this baseline? Yes   Triage Complete: Triage complete  Chief Complaint Shob Chest Pain  Triage Note Pt states chest pain right side up into neck that started around 630pm. No cardiac hx   Allergies Allergies  Allergen Reactions  . Aciphex [Rabeprazole Sodium] Diarrhea  . Lipitor [Atorvastatin] Other (See Comments)    MYALGIA  . Penicillins     Swelling   . Sulfa Antibiotics     Swelling and rash   . Viagra [Sildenafil Citrate] Other (See Comments)    headache  . Zolpidem Other (See Comments)    Blacked out    Level of Care/Admitting Diagnosis ED Disposition    ED Disposition Condition [facility] Area: [place] [[id number]]  Level of Care: Med-Surg [16]  Covid Evaluation: N/A  Diagnosis: Sepsis [facility]) [[id number]]  Admitting Physician: Harrie Foreman [[id number]]  Attending Physician: Harrie Foreman [[id number]]  Estimated length of stay: past midnight tomorrow  Certification:: I certify this patient will need inpatient services for at least 2 midnights  PT Class (Do Not Modify): Inpatient [101]  PT Acc Code (Do Not Modify): Private [1]       B Medical/Surgery History Past Medical History:  Diagnosis Date  . Colitis   . Colitis   . Diverticulosis   . Diverticulosis   . Erectile dysfunction   . Fatty liver   . Gastritis   . GERD (gastroesophageal reflux disease)   . Hyperglycemia   . Hyperlipidemia   . Rib fracture   . Sleep apnea   . Testosterone deficiency    Past Surgical History:  Procedure Laterality Date  . COLONOSCOPY    . COLONOSCOPY WITH PROPOFOL N/A 01/27/2018   Procedure: COLONOSCOPY WITH PROPOFOL;  Surgeon: Lollie Sails, MD;  Location:  [facility] ENDOSCOPY;  Service: Endoscopy;  Laterality: N/A;  . ESOPHAGOGASTRODUODENOSCOPY    . ESOPHAGOGASTRODUODENOSCOPY (EGD) WITH PROPOFOL N/A 01/27/2018   Procedure: ESOPHAGOGASTRODUODENOSCOPY (EGD) WITH PROPOFOL;  Surgeon: Lollie Sails, MD;  Location: [facility] ENDOSCOPY;  Service: Endoscopy;  Laterality: N/A;  . HERNIA REPAIR    . KNEE ARTHROSCOPY    . KNEE ARTHROSCOPY WITH MEDIAL MENISECTOMY Left 05/11/2016   Procedure: KNEE ARTHROSCOPY WITH DEBRIDEMENT AND PARTIAL MEDIAL AND LATERL MENISECTOMY, ABRASION CHONDROPLASTY, FEMORAL TROCHLEA MEDIAL FEMORAL CHONDYL;  Surgeon: Corky Mull, MD;  Location: [facility] ORS;  Service: Orthopedics;  Laterality: Left;     A IV Location/Drains/Wounds Patient Lines/Drains/Airways Status   Active Line/Drains/Airways    Name:   Placement date:   Placement time:   Site:   Days:   Peripheral IV 01/28/19 Right Antecubital   01/28/19    2146    Antecubital   1   Peripheral IV 01/29/19 Right Wrist   01/29/19    0402    Wrist   less than 1   Airway   01/27/18    1032     367   Airway 10 mm   01/27/18    1050     367   Incision (Closed) 05/11/16 Knee Left   05/11/16    0833     993          Intake/Output Last 24 hours  Intake/Output Summary (Last 24  hours) at 01/29/2019 0405 Last data filed at 01/29/2019 0249 Gross per 24 hour  Intake 193.27 ml  Output -  Net 193.27 ml    Labs/Imaging Results for orders placed or performed during the hospital encounter of 01/28/19 (from the past 48 hour(s))  Basic metabolic panel     Status: Abnormal   Collection Time: 01/28/19  8:50 PM  Result Value Ref Range   Sodium 138 135 - 145 mmol/L   Potassium 3.5 3.5 - 5.1 mmol/L   Chloride 99 98 - 111 mmol/L   CO2 29 22 - 32 mmol/L   Glucose, Bld 110 (H) 70 - 99 mg/dL   BUN 14 6 - 20 mg/dL   Creatinine, Ser 0.92 0.61 - 1.24 mg/dL   Calcium 9.2 8.9 - 10.3 mg/dL   GFR calc non Af Amer >60 >60 mL/min   GFR calc Af Amer >60 >60 mL/min   Anion gap 10 5 - 15    Comment: Performed  at [facility], [address] [place], [place] [address]  CBC     Status: None   Collection Time: 01/28/19  8:50 PM  Result Value Ref Range   WBC 10.0 4.0 - 10.5 K/uL   RBC 5.14 4.22 - 5.81 MIL/uL   Hemoglobin 15.9 13.0 - 17.0 g/dL   HCT 47.3 39.0 - 52.0 %   MCV 92.0 80.0 - 100.0 fL   MCH 30.9 26.0 - 34.0 pg   MCHC 33.6 30.0 - 36.0 g/dL   RDW 14.0 11.5 - 15.5 %   Platelets 269 150 - 400 K/uL   nRBC 0.0 0.0 - 0.2 %    Comment: Performed at [facility], [address] [place], [place] [address]  Troponin I - ONCE - STAT     Status: None   Collection Time: 01/28/19  8:50 PM  Result Value Ref Range   Troponin I <0.03 <0.03 ng/mL    Comment: Performed at [facility], [address] [place], [place] [address]  SARS Coronavirus 2 (CEPHEID- Performed in [place] hospital lab), Hosp Order     Status: None   Collection Time: 01/29/19  1:05 AM  Result Value Ref Range   SARS Coronavirus 2 NEGATIVE NEGATIVE    Comment: (NOTE) If result is NEGATIVE SARS-CoV-2 target nucleic acids are NOT DETECTED. The SARS-CoV-2 RNA is generally detectable in upper and lower  respiratory specimens during the acute phase of infection. The lowest  concentration of SARS-CoV-2 viral copies this assay can detect is 250  copies / mL. A negative result does not preclude SARS-CoV-2 infection  and should not be used as the sole basis for treatment or other  patient management decisions.  A negative result may occur with  improper specimen collection / handling, submission of specimen other  than nasopharyngeal swab, presence of viral mutation(s) within the  areas targeted by this assay, and inadequate number of viral copies  (<250 copies / mL). A negative result must be combined with clinical  observations, patient history, and epidemiological information. If result is POSITIVE SARS-CoV-2 target nucleic acids are DETECTED. The SARS-CoV-2 RNA is generally detectable in upper  and lower  respiratory specimens dur ing the acute phase of infection.  Positive  results are indicative of active infection with SARS-CoV-2.  Clinical  correlation with patient history and other diagnostic information is  necessary to determine patient infection status.  Positive results do  not rule out bacterial infection or co-infection with other viruses. If  result is PRESUMPTIVE POSTIVE SARS-CoV-2 nucleic acids MAY BE PRESENT.   A presumptive positive result was obtained on the submitted specimen  and confirmed on repeat testing.  While 2019 novel coronavirus  (SARS-CoV-2) nucleic acids may be present in the submitted sample  additional confirmatory testing may be necessary for epidemiological  and / or clinical management purposes  to differentiate between  SARS-CoV-2 and other Sarbecovirus currently known to infect humans.  If clinically indicated additional testing with an alternate test  methodology [phone]) is advised. The SARS-CoV-2 RNA is generally  detectable in upper and lower respiratory sp ecimens during the acute  phase of infection. The expected result is Negative. Fact Sheet for Patients:  StrictlyIdeas.no Fact Sheet for Healthcare Providers: BankingDealers.co.za This test is not yet approved or cleared by the [place] FDA and has been authorized for detection and/or diagnosis of SARS-CoV-2 by FDA under an Emergency Use Authorization (EUA).  This EUA will remain in effect (meaning this test can be used) for the duration of the COVID-19 declaration under Section 564(b)(1) of the Act, 21 U.S.C. section 360bbb-3(b)(1), unless the authorization is terminated or revoked sooner. Performed at [facility], [address] [place], [place] [address]    Dg Chest 2 View  Result Date: 01/28/2019 CLINICAL DATA:  Awoke from a nap with LEFT chest pain, sharp pain radiating to LEFT arm, history of GERD EXAM: CHEST  - 2 VIEW COMPARISON:  01/24/2005 FINDINGS: Upper normal heart size. Mediastinal contours and pulmonary vascularity normal. Decreased lung volumes with bibasilar atelectasis. Lungs otherwise clear. No infiltrate, pleural effusion or pneumothorax. Old appearing fracture of the posterior RIGHT seventh rib. IMPRESSION: Bibasilar atelectasis. Electronically Signed   By: Lavonia Dana M.D.   On: 01/28/2019 21:30   Ct Angio Chest Pe W And/or Wo Contrast  Result Date: 01/28/2019 CLINICAL DATA:  Right chest pain. EXAM: CT ANGIOGRAPHY CHEST WITH CONTRAST TECHNIQUE: Multidetector CT imaging of the chest was performed using the standard protocol during bolus administration of intravenous contrast. Multiplanar CT image reconstructions and MIPs were obtained to evaluate the vascular anatomy. CONTRAST:  143 mL OMNIPAQUE IOHEXOL 350 MG/ML SOLN COMPARISON:  None. FINDINGS: Cardiovascular: Image quality is decreased due to respiratory motion. The patient was unable to hold breath. No visible pulmonary embolus. The vessels in the lower lobes are obscured by respiratory motion. Heart is borderline in size. Aorta normal caliber. Mediastinum/Nodes: No mediastinal, hilar, or axillary adenopathy. Lungs/Pleura: Left lower lobe airspace disease concerning for pneumonia. Small left pleural effusion. Lingular atelectasis or infiltrate also present at the left lung base. No focal opacity on the right. Upper Abdomen: Diffuse low-density throughout the liver compatible with fatty infiltration. No acute findings Musculoskeletal: Chest wall soft tissues are unremarkable. Review of the MIP images confirms the above findings. IMPRESSION: Decreased image quality due to respiratory motion. No visible pulmonary embolus. Left lower lobe and lingular airspace disease at the left lung base concerning for pneumonia. Small left pleural effusion. Fatty infiltration of the liver. Electronically Signed   By: Rolm Baptise M.D.   On: 01/28/2019 23:00     Pending Labs Unresulted Labs (From admission, onward)    Start     Ordered   01/29/19 0301  Lactic acid, plasma  STAT Now then every 3 hours,   STAT     01/29/19 0301   01/29/19 0301  Blood Culture (routine x 2)  BLOOD CULTURE X 2,   STAT     01/29/19 0301   Signed and Held  Creatinine, serum  (enoxaparin (LOVENOX)    CrCl >/= 30 ml/min)  Weekly,   R    Comments:  while on enoxaparin therapy    Signed and Held   Signed and Held  TSH  Add-on,   R     Signed and Held   Signed and Held  Hemoglobin A1c  Add-on,   R     Signed and Held          Vitals/Pain Today's Vitals   01/29/19 0000 01/29/19 0030 01/29/19 0102 01/29/19 0232  BP:   135/85 113/66  Pulse: (!) 102 (!) 104 95 94  Resp: (!) 24 16 17  (!) 27  Temp:    99 F (37.2 C)  TempSrc:    Oral  SpO2: 94% 93% 95%   Weight:      Height:      PainSc:        Isolation Precautions Droplet and Contact precautions  Medications Medications  sodium chloride flush (NS) 0.9 % injection 3 mL (3 mLs Intravenous Not Given 01/29/19 0101)  sodium chloride 0.9 % bolus 500 mL (has no administration in time range)  levofloxacin (LEVAQUIN) IVPB 750 mg (has no administration in time range)  metoCLOPramide (REGLAN) injection 10 mg (10 mg Intravenous Given 01/28/19 2154)  famotidine (PEPCID) IVPB 20 mg premix ( Intravenous Stopped 01/28/19 2224)  iohexol (OMNIPAQUE) 350 MG/ML injection 75 mL (75 mLs Intravenous Contrast Given 01/28/19 2228)  levofloxacin (LEVAQUIN) IVPB 750 mg (0 mg Intravenous Stopped 01/29/19 0249)    Mobility walks Low fall risk   Focused Assessments Pulmonary Assessment Handoff:  Lung sounds:   O2 Device: Room Air        R Recommendations: See Admitting Provider Note  Report given to:   Additional Notes:

## 2019-01-29 NOTE — ED Notes (Signed)
Pt in room at this time in NAD.

## 2019-01-29 NOTE — Plan of Care (Signed)

## 2019-01-29 NOTE — H&P (Addendum)
Brandon Caldwell is an 48 y.o. male.   Chief Complaint: Chest pain HPI: The patient with past medical history of hyperlipidemia and diverticulosis presents to the emergency department complaining of chest pain.  The patient reports that his pain began at rest.  The patient reports awaking from a nap "feeling like he was suffocating".  He admits to right-sided chest pain that radiated into his neck.  He denies nausea, vomiting or diaphoresis.  Initial evaluation in the emergency department showed no EKG changes.  Troponin was within normal limits and the patient's chest pain subsided.  However he did have difficulty taking a deep breath which prompted CTA of the chest to rule out pulmonary embolism.  No clot was found however an infiltrate was observed in the left lower lung with a small left pleural effusion.  Sepsis protocol was initiated prior to the emergency department staff calling the hospitalist service for admission.  Past Medical History:  Diagnosis Date  . Colitis   . Colitis   . Diverticulosis   . Diverticulosis   . Erectile dysfunction   . Fatty liver   . Gastritis   . GERD (gastroesophageal reflux disease)   . Hyperglycemia   . Hyperlipidemia   . Rib fracture   . Sleep apnea   . Testosterone deficiency     Past Surgical History:  Procedure Laterality Date  . COLONOSCOPY    . COLONOSCOPY WITH PROPOFOL N/A 01/27/2018   Procedure: COLONOSCOPY WITH PROPOFOL;  Surgeon: Lollie Sails, MD;  Location: [facility] ENDOSCOPY;  Service: Endoscopy;  Laterality: N/A;  . ESOPHAGOGASTRODUODENOSCOPY    . ESOPHAGOGASTRODUODENOSCOPY (EGD) WITH PROPOFOL N/A 01/27/2018   Procedure: ESOPHAGOGASTRODUODENOSCOPY (EGD) WITH PROPOFOL;  Surgeon: Lollie Sails, MD;  Location: [facility] ENDOSCOPY;  Service: Endoscopy;  Laterality: N/A;  . HERNIA REPAIR    . KNEE ARTHROSCOPY    . KNEE ARTHROSCOPY WITH MEDIAL MENISECTOMY Left 05/11/2016   Procedure: KNEE ARTHROSCOPY WITH DEBRIDEMENT AND PARTIAL MEDIAL  AND LATERL MENISECTOMY, ABRASION CHONDROPLASTY, FEMORAL TROCHLEA MEDIAL FEMORAL CHONDYL;  Surgeon: Corky Mull, MD;  Location: [facility] ORS;  Service: Orthopedics;  Laterality: Left;    Family History  Problem Relation Age of Onset  . Alcohol abuse Father    Social History:  reports that he quit smoking about 12 years ago. His smoking use included cigarettes. He has a 15.00 pack-year smoking history. He has never used smokeless tobacco. He reports that he does not drink alcohol or use drugs.  Allergies:  Allergies  Allergen Reactions  . Aciphex [Rabeprazole Sodium] Diarrhea  . Lipitor [Atorvastatin] Other (See Comments)    MYALGIA  . Penicillins     Swelling   . Sulfa Antibiotics     Swelling and rash   . Viagra [Sildenafil Citrate] Other (See Comments)    headache  . Zolpidem Other (See Comments)    Blacked out    Medications Prior to Admission  Medication Sig Dispense Refill  . acetaminophen (TYLENOL) 500 MG tablet Take 1,000 mg by mouth every 8 (eight) hours as needed.    . fenofibrate (TRICOR) 145 MG tablet Take 145 mg by mouth every morning.    Marland Kitchen omeprazole (PRILOSEC) 10 MG capsule Take 10 mg by mouth daily.    . sertraline (ZOLOFT) 50 MG tablet Take 50 mg by mouth daily.     Marland Kitchen testosterone cypionate (DEPOTESTOTERONE CYPIONATE) 100 MG/ML injection Inject 100 mg into the muscle every 7 (seven) days. For IM use only-ON SUNDAYS      Results  for orders placed or performed during the hospital encounter of 01/28/19 (from the past 48 hour(s))  Basic metabolic panel     Status: Abnormal   Collection Time: 01/28/19  8:50 PM  Result Value Ref Range   Sodium 138 135 - 145 mmol/L   Potassium 3.5 3.5 - 5.1 mmol/L   Chloride 99 98 - 111 mmol/L   CO2 29 22 - 32 mmol/L   Glucose, Bld 110 (H) 70 - 99 mg/dL   BUN 14 6 - 20 mg/dL   Creatinine, Ser 0.92 0.61 - 1.24 mg/dL   Calcium 9.2 8.9 - 10.3 mg/dL   GFR calc non Af Amer >60 >60 mL/min   GFR calc Af Amer >60 >60 mL/min   Anion gap 10  5 - 15    Comment: Performed at [facility], [address] [place], [place] [address]  CBC     Status: None   Collection Time: 01/28/19  8:50 PM  Result Value Ref Range   WBC 10.0 4.0 - 10.5 K/uL   RBC 5.14 4.22 - 5.81 MIL/uL   Hemoglobin 15.9 13.0 - 17.0 g/dL   HCT 47.3 39.0 - 52.0 %   MCV 92.0 80.0 - 100.0 fL   MCH 30.9 26.0 - 34.0 pg   MCHC 33.6 30.0 - 36.0 g/dL   RDW 14.0 11.5 - 15.5 %   Platelets 269 150 - 400 K/uL   nRBC 0.0 0.0 - 0.2 %    Comment: Performed at [facility], [address] [place], [place] [address]  Troponin I - ONCE - STAT     Status: None   Collection Time: 01/28/19  8:50 PM  Result Value Ref Range   Troponin I <0.03 <0.03 ng/mL    Comment: Performed at [facility], [address] [place], [place] [address]  TSH     Status: None   Collection Time: 01/28/19  8:50 PM  Result Value Ref Range   TSH 2.199 0.350 - 4.500 uIU/mL    Comment: Performed by a 3rd Generation assay with a functional sensitivity of <=0.01 uIU/mL. Performed at [facility], [address] [place], [place] [address]   SARS Coronavirus 2 (CEPHEID- Performed in [facility] hospital lab), Hosp Order     Status: None   Collection Time: 01/29/19  1:05 AM  Result Value Ref Range   SARS Coronavirus 2 NEGATIVE NEGATIVE    Comment: (NOTE) If result is NEGATIVE SARS-CoV-2 target nucleic acids are NOT DETECTED. The SARS-CoV-2 RNA is generally detectable in upper and lower  respiratory specimens during the acute phase of infection. The lowest  concentration of SARS-CoV-2 viral copies this assay can detect is 250  copies / mL. A negative result does not preclude SARS-CoV-2 infection  and should not be used as the sole basis for treatment or other  patient management decisions.  A negative result may occur with  improper specimen collection / handling, submission of specimen other  than nasopharyngeal swab, presence of viral mutation(s) within the   areas targeted by this assay, and inadequate number of viral copies  (<250 copies / mL). A negative result must be combined with clinical  observations, patient history, and epidemiological information. If result is POSITIVE SARS-CoV-2 target nucleic acids are DETECTED. The SARS-CoV-2 RNA is generally detectable in upper and lower  respiratory specimens dur ing the acute phase of infection.  Positive  results are indicative of active infection with SARS-CoV-2.  Clinical  correlation with patient  history and other diagnostic information is  necessary to determine patient infection status.  Positive results do  not rule out bacterial infection or co-infection with other viruses. If result is PRESUMPTIVE POSTIVE SARS-CoV-2 nucleic acids MAY BE PRESENT.   A presumptive positive result was obtained on the submitted specimen  and confirmed on repeat testing.  While 2019 novel coronavirus  (SARS-CoV-2) nucleic acids may be present in the submitted sample  additional confirmatory testing may be necessary for epidemiological  and / or clinical management purposes  to differentiate between  SARS-CoV-2 and other Sarbecovirus currently known to infect humans.  If clinically indicated additional testing with an alternate test  methodology [phone]) is advised. The SARS-CoV-2 RNA is generally  detectable in upper and lower respiratory sp ecimens during the acute  phase of infection. The expected result is Negative. Fact Sheet for Patients:  StrictlyIdeas.no Fact Sheet for Healthcare Providers: BankingDealers.co.za This test is not yet approved or cleared by the [place] FDA and has been authorized for detection and/or diagnosis of SARS-CoV-2 by FDA under an Emergency Use Authorization (EUA).  This EUA will remain in effect (meaning this test can be used) for the duration of the COVID-19 declaration under Section 564(b)(1) of the Act, 21  U.S.C. section 360bbb-3(b)(1), unless the authorization is terminated or revoked sooner. Performed at [facility], [address] [place], [place] [address]   Lactic acid, plasma     Status: None   Collection Time: 01/29/19  3:51 AM  Result Value Ref Range   Lactic Acid, Venous 1.1 0.5 - 1.9 mmol/L    Comment: Performed at [facility], [address] [place], [place] [address]  Lactic acid, plasma     Status: None   Collection Time: 01/29/19  5:54 AM  Result Value Ref Range   Lactic Acid, Venous 1.0 0.5 - 1.9 mmol/L    Comment: Performed at [facility], [address] [place], [place] [address]   Dg Chest 2 View  Result Date: 01/28/2019 CLINICAL DATA:  Awoke from a nap with LEFT chest pain, sharp pain radiating to LEFT arm, history of GERD EXAM: CHEST - 2 VIEW COMPARISON:  01/24/2005 FINDINGS: Upper normal heart size. Mediastinal contours and pulmonary vascularity normal. Decreased lung volumes with bibasilar atelectasis. Lungs otherwise clear. No infiltrate, pleural effusion or pneumothorax. Old appearing fracture of the posterior RIGHT seventh rib. IMPRESSION: Bibasilar atelectasis. Electronically Signed   By: Lavonia Dana M.D.   On: 01/28/2019 21:30   Ct Angio Chest Pe W And/or Wo Contrast  Result Date: 01/28/2019 CLINICAL DATA:  Right chest pain. EXAM: CT ANGIOGRAPHY CHEST WITH CONTRAST TECHNIQUE: Multidetector CT imaging of the chest was performed using the standard protocol during bolus administration of intravenous contrast. Multiplanar CT image reconstructions and MIPs were obtained to evaluate the vascular anatomy. CONTRAST:  143 mL OMNIPAQUE IOHEXOL 350 MG/ML SOLN COMPARISON:  None. FINDINGS: Cardiovascular: Image quality is decreased due to respiratory motion. The patient was unable to hold breath. No visible pulmonary embolus. The vessels in the lower lobes are obscured by respiratory motion. Heart is borderline in size. Aorta normal caliber.  Mediastinum/Nodes: No mediastinal, hilar, or axillary adenopathy. Lungs/Pleura: Left lower lobe airspace disease concerning for pneumonia. Small left pleural effusion. Lingular atelectasis or infiltrate also present at the left lung base. No focal opacity on the right. Upper Abdomen: Diffuse low-density throughout the liver compatible with fatty infiltration. No acute findings Musculoskeletal: Chest wall soft tissues are unremarkable. Review of the MIP  images confirms the above findings. IMPRESSION: Decreased image quality due to respiratory motion. No visible pulmonary embolus. Left lower lobe and lingular airspace disease at the left lung base concerning for pneumonia. Small left pleural effusion. Fatty infiltration of the liver. Electronically Signed   By: Rolm Baptise M.D.   On: 01/28/2019 23:00    Review of Systems  Constitutional: Negative for chills and fever.  HENT: Negative for sore throat and tinnitus.   Eyes: Negative for blurred vision and redness.  Respiratory: Positive for shortness of breath. Negative for cough.   Cardiovascular: Positive for chest pain. Negative for palpitations, orthopnea and PND.  Gastrointestinal: Negative for abdominal pain, diarrhea, nausea and vomiting.  Genitourinary: Negative for dysuria, frequency and urgency.  Musculoskeletal: Negative for joint pain and myalgias.  Skin: Negative for rash.       No lesions  Neurological: Negative for speech change, focal weakness and weakness.  Endo/Heme/Allergies: Does not bruise/bleed easily.       No temperature intolerance  Psychiatric/Behavioral: Negative for depression and suicidal ideas.    Blood pressure 134/77, pulse 88, temperature 99.1 F (37.3 C), temperature source Oral, resp. rate (!) 27, height 6' (1.829 m), weight 124.5 kg, SpO2 94 %. Physical Exam  Vitals reviewed. Constitutional: He is oriented to person, place, and time. He appears well-developed and well-nourished. No distress.  HENT:  Head:  Normocephalic and atraumatic.  Mouth/Throat: Oropharynx is clear and moist.  Eyes: Pupils are equal, round, and reactive to light. Conjunctivae and EOM are normal. No scleral icterus.  Neck: Normal range of motion. Neck supple. No JVD present. No tracheal deviation present. No thyromegaly present.  Cardiovascular: Normal rate, regular rhythm and normal heart sounds. Exam reveals no gallop and no friction rub.  No murmur heard. Respiratory: Effort normal and breath sounds normal. No respiratory distress.  GI: Soft. Bowel sounds are normal. He exhibits no distension. There is no abdominal tenderness.  Genitourinary:    Genitourinary Comments: Deferred   Musculoskeletal: Normal range of motion.        General: No edema.  Lymphadenopathy:    He has no cervical adenopathy.  Neurological: He is alert and oriented to person, place, and time. No cranial nerve deficit.  Skin: Skin is warm and dry. No rash noted. No erythema.  Psychiatric: He has a normal mood and affect. His behavior is normal. Judgment and thought content normal.     Assessment/Plan This is a 48 year old male admitted for sepsis. 1.  Sepsis: The patient meets criteria via tachycardia and intermittent tachypnea.  He is hemodynamically stable.  Source appears to be pneumonia.  Follow blood cultures were growth and sensitivities.  Obtain sputum sample if possible. 2.  Community-acquired pneumonia: Associated shortness of breath contributes to atypical chest pain.  Obtain another set of cardiac biomarkers for reassurance.  Monitor telemetry.  Continue Levaquin 3.  Hyperlipidemia: The patient has fatty change of his liver.  He is aware that he has to change his diet and continue to lose weight.  Continue fenofibrate 4.  Obesity: BMI is 37.2; encourage healthy diet and exercise 5.  Depression: Continue Zoloft 6.  DVT prophylaxis: Lovenox 7.  GI prophylaxis: Pantoprazole per home regimen The patient is a full code.  Time spent on  admission orders and patient care approximately 45 minutes  Harrie Foreman, MD 01/29/2019, 6:45 AM

## 2019-01-29 NOTE — Consult Note (Signed)
Pharmacy Antibiotic Note  Brandon Caldwell is a 48 y.o. male admitted on 01/28/2019 with pneumonia.  Pharmacy has been consulted for levofloxacin dosing.  Plan: Start levofloxacin 750mg  IV every 24 hours   Height: 6' (182.9 cm) Weight: 265 lb (120.2 kg) IBW/kg (Calculated) : 77.6  Temp (24hrs), Avg:98.6 F (37 C), Min:98.2 F (36.8 C), Max:99 F (37.2 C)  Recent Labs  Lab 01/28/19 2050  WBC 10.0  CREATININE 0.92    Estimated Creatinine Clearance: 130 mL/min (by C-G formula based on SCr of 0.92 mg/dL).    Allergies  Allergen Reactions  . Aciphex [Rabeprazole Sodium] Diarrhea  . Lipitor [Atorvastatin] Other (See Comments)    MYALGIA  . Penicillins     Swelling   . Sulfa Antibiotics     Swelling and rash   . Viagra [Sildenafil Citrate] Other (See Comments)    headache  . Zolpidem Other (See Comments)    Blacked out    Antimicrobials this admission: 5/4 levofloxacin  >>   Microbiology results: 5/4 BCx: pending 5/4 SARS Coronavirus 2: negative  Thank you for allowing pharmacy to be a part of this patient's care.  Pernell Dupre, PharmD, BCPS Clinical Pharmacist 01/29/2019 3:54 AM

## 2019-01-29 NOTE — ED Notes (Signed)
Pt had two IV attempts by Kennon Portela.

## 2019-01-29 NOTE — Progress Notes (Signed)
   01/29/19 1500  Clinical Encounter Type  Visited With Patient;Health care provider  Visit Type Initial  Referral From Nurse  Stress Factors  Patient Stress Factors Health changes   OR received to update or complete an AD. Upon arrival, the patient was resting and had been "in and out of sleep". He was pleasant and concerned about his health and his family members who are worried about him. He said this is the first time he has every been hospitalized and he hopes that his stay will not be long. He confirmed interest in the AD and will review the AD documents today. He will ask the nurse to call or page the chaplain if questions or concerns arise.

## 2019-01-29 NOTE — ED Notes (Signed)
This EDT preformed a straight stick on pt for set of blood cultures with the use of 21g kurin device, specimen collection successful and pr tolerated well

## 2019-01-29 NOTE — Progress Notes (Signed)
The patient denies any cough or phlegm or shortness of breath.  No hypoxia in room air. Vital signs and labs reviewed. Continue current treatment. Discussed with patient and RN.  Time spent about 20 minutes.

## 2019-01-29 NOTE — ED Notes (Signed)
Patient's IV line occluded. Line cleared, channel restarted, and patient advised about positioning of arm.

## 2019-01-29 NOTE — ED Notes (Signed)
Bed readjusted for comfort. Pt updated on admission process. Pt verbalizes understanding. Call bell at right side.

## 2019-01-30 MED ORDER — LEVOFLOXACIN 750 MG PO TABS: 750.0000 mg | ORAL_TABLET | Freq: Every day | ORAL | 0 refills | Status: AC

## 2019-01-30 NOTE — Discharge Summary (Signed)
[facility] [place] NAME: Brandon Caldwell    MR#:  [id number]  DATE OF BIRTH:  1971-06-11  DATE OF ADMISSION:  01/28/2019   ADMITTING PHYSICIAN: Harrie Foreman, MD  DATE OF DISCHARGE: 01/30/2019  PRIMARY CARE PHYSICIAN: Tama High III, MD   ADMISSION DIAGNOSIS:  Nonspecific chest pain [R07.9] Community acquired pneumonia of left lower lobe of lung ([place]) [J18.1] DISCHARGE DIAGNOSIS:  Active Problems:   Sepsis ([place])  SECONDARY DIAGNOSIS:   Past Medical History:  Diagnosis Date   Colitis    Colitis    Diverticulosis    Diverticulosis    Erectile dysfunction    Fatty liver    Gastritis    GERD (gastroesophageal reflux disease)    Hyperglycemia    Hyperlipidemia    Rib fracture    Sleep apnea    Testosterone deficiency    HOSPITAL COURSE:  This is a 48 year old male admitted for sepsis. 1.  Sepsis due to Community-acquired pneumonia He is treated with iv Levaquin, change to po.  3.  Hyperlipidemia: The patient has fatty change of his liver.  He is aware that he has to change his diet and continue to lose weight.  Continue fenofibrate 4.  Obesity: BMI is 37.2; encourage healthy diet and exercise 5.  Depression: Continue Zoloft  DISCHARGE CONDITIONS:  Stable, discharge to home today. CONSULTS OBTAINED:   DRUG ALLERGIES:   Allergies  Allergen Reactions   Aciphex [Rabeprazole Sodium] Diarrhea   Lipitor [Atorvastatin] Other (See Comments)    MYALGIA   Penicillins     Swelling    Sulfa Antibiotics     Swelling and rash    Viagra [Sildenafil Citrate] Other (See Comments)    headache   Zolpidem Other (See Comments)    Blacked out   DISCHARGE MEDICATIONS:   Allergies as of 01/30/2019      Reactions   Aciphex [rabeprazole Sodium] Diarrhea   Lipitor [atorvastatin] Other (See Comments)   MYALGIA   Penicillins    Swelling    Sulfa Antibiotics    Swelling and rash    Viagra [sildenafil  Citrate] Other (See Comments)   headache   Zolpidem Other (See Comments)   Blacked out      Medication List    TAKE these medications   acetaminophen 500 MG tablet Commonly known as:  TYLENOL Take 1,000 mg by mouth every 8 (eight) hours as needed.   fenofibrate 145 MG tablet Commonly known as:  TRICOR Take 145 mg by mouth every morning.   levofloxacin 750 MG tablet Commonly known as:  Levaquin Take 1 tablet (750 mg total) by mouth daily for 7 days.   omeprazole 10 MG capsule Commonly known as:  PRILOSEC Take 10 mg by mouth daily.   sertraline 50 MG tablet Commonly known as:  ZOLOFT Take 50 mg by mouth daily.   testosterone cypionate 100 MG/ML injection Commonly known as:  DEPOTESTOTERONE CYPIONATE Inject 100 mg into the muscle every 7 (seven) days. For IM use only-ON SUNDAYS        DISCHARGE INSTRUCTIONS:  See AVS.  If you experience worsening of your admission symptoms, develop shortness of breath, life threatening emergency, suicidal or homicidal thoughts you must seek medical attention immediately by calling 911 or calling your MD immediately  if symptoms less severe.  You Must read complete instructions/literature along with all the possible adverse reactions/side effects for all the Medicines you take and that have been prescribed  to you. Take any new Medicines after you have completely understood and accpet all the possible adverse reactions/side effects.   Please note  You were cared for by a hospitalist during your hospital stay. If you have any questions about your discharge medications or the care you received while you were in the hospital after you are discharged, you can call the unit and asked to speak with the hospitalist on call if the hospitalist that took care of you is not available. Once you are discharged, your primary care physician will handle any further medical issues. Please note that NO REFILLS for any discharge medications will be authorized  once you are discharged, as it is imperative that you return to your primary care physician (or establish a relationship with a primary care physician if you do not have one) for your aftercare needs so that they can reassess your need for medications and monitor your lab values.    On the day of Discharge:  VITAL SIGNS:  Blood pressure (!) 142/76, pulse 95, temperature 98.4 F (36.9 C), temperature source Oral, resp. rate 20, height 6' (1.829 m), weight 124.5 kg, SpO2 95 %. PHYSICAL EXAMINATION:  GENERAL:  48 y.o.-year-old patient lying in the bed with no acute distress. Obesity.  EYES: Pupils equal, round, reactive to light and accommodation. No scleral icterus. Extraocular muscles intact.  HEENT: Head atraumatic, normocephalic. Oropharynx and nasopharynx clear.  NECK:  Supple, no jugular venous distention. No thyroid enlargement, no tenderness.  LUNGS: Normal breath sounds bilaterally, no wheezing, rales,rhonchi or crepitation. No use of accessory muscles of respiration.  CARDIOVASCULAR: S1, S2 normal. No murmurs, rubs, or gallops.  ABDOMEN: Soft, non-tender, non-distended. Bowel sounds present. No organomegaly or mass.  EXTREMITIES: No pedal edema, cyanosis, or clubbing.  NEUROLOGIC: Cranial nerves II through XII are intact. Muscle strength 5/5 in all extremities. Sensation intact. Gait not checked.  PSYCHIATRIC: The patient is alert and oriented x 3.  SKIN: No obvious rash, lesion, or ulcer.  DATA REVIEW:   CBC Recent Labs  Lab 01/28/19 2050  WBC 10.0  HGB 15.9  HCT 47.3  PLT 269    Chemistries  Recent Labs  Lab 01/28/19 2050  NA 138  K 3.5  CL 99  CO2 29  GLUCOSE 110*  BUN 14  CREATININE 0.92  CALCIUM 9.2     Microbiology Results  Results for orders placed or performed during the hospital encounter of 01/28/19  SARS Coronavirus 2 (CEPHEID- Performed in [place] hospital lab), Hosp Order     Status: None   Collection Time: 01/29/19  1:05 AM  Result Value Ref  Range Status   SARS Coronavirus 2 NEGATIVE NEGATIVE Final    Comment: (NOTE) If result is NEGATIVE SARS-CoV-2 target nucleic acids are NOT DETECTED. The SARS-CoV-2 RNA is generally detectable in upper and lower  respiratory specimens during the acute phase of infection. The lowest  concentration of SARS-CoV-2 viral copies this assay can detect is 250  copies / mL. A negative result does not preclude SARS-CoV-2 infection  and should not be used as the sole basis for treatment or other  patient management decisions.  A negative result may occur with  improper specimen collection / handling, submission of specimen other  than nasopharyngeal swab, presence of viral mutation(s) within the  areas targeted by this assay, and inadequate number of viral copies  (<250 copies / mL). A negative result must be combined with clinical  observations, patient history, and epidemiological information. If result  is POSITIVE SARS-CoV-2 target nucleic acids are DETECTED. The SARS-CoV-2 RNA is generally detectable in upper and lower  respiratory specimens dur ing the acute phase of infection.  Positive  results are indicative of active infection with SARS-CoV-2.  Clinical  correlation with patient history and other diagnostic information is  necessary to determine patient infection status.  Positive results do  not rule out bacterial infection or co-infection with other viruses. If result is PRESUMPTIVE POSTIVE SARS-CoV-2 nucleic acids MAY BE PRESENT.   A presumptive positive result was obtained on the submitted specimen  and confirmed on repeat testing.  While 2019 novel coronavirus  (SARS-CoV-2) nucleic acids may be present in the submitted sample  additional confirmatory testing may be necessary for epidemiological  and / or clinical management purposes  to differentiate between  SARS-CoV-2 and other Sarbecovirus currently known to infect humans.  If clinically indicated additional testing with an  alternate test  methodology [phone]) is advised. The SARS-CoV-2 RNA is generally  detectable in upper and lower respiratory sp ecimens during the acute  phase of infection. The expected result is Negative. Fact Sheet for Patients:  StrictlyIdeas.no Fact Sheet for Healthcare Providers: BankingDealers.co.za This test is not yet approved or cleared by the [place] FDA and has been authorized for detection and/or diagnosis of SARS-CoV-2 by FDA under an Emergency Use Authorization (EUA).  This EUA will remain in effect (meaning this test can be used) for the duration of the COVID-19 declaration under Section 564(b)(1) of the Act, 21 U.S.C. section 360bbb-3(b)(1), unless the authorization is terminated or revoked sooner. Performed at [facility], [address] [place], [place] [address]   Respiratory Panel by PCR     Status: None   Collection Time: 01/29/19  1:05 AM  Result Value Ref Range Status   Adenovirus NOT DETECTED NOT DETECTED Final   Coronavirus 229E NOT DETECTED NOT DETECTED Final    Comment: (NOTE) The Coronavirus on the Respiratory Panel, DOES NOT test for the novel  Coronavirus (2019 nCoV)    Coronavirus HKU1 NOT DETECTED NOT DETECTED Final   Coronavirus NL63 NOT DETECTED NOT DETECTED Final   Coronavirus OC43 NOT DETECTED NOT DETECTED Final   Metapneumovirus NOT DETECTED NOT DETECTED Final   Rhinovirus / Enterovirus NOT DETECTED NOT DETECTED Final   Influenza A NOT DETECTED NOT DETECTED Final   Influenza B NOT DETECTED NOT DETECTED Final   Parainfluenza Virus 1 NOT DETECTED NOT DETECTED Final   Parainfluenza Virus 2 NOT DETECTED NOT DETECTED Final   Parainfluenza Virus 3 NOT DETECTED NOT DETECTED Final   Parainfluenza Virus 4 NOT DETECTED NOT DETECTED Final   Respiratory Syncytial Virus NOT DETECTED NOT DETECTED Final   Bordetella pertussis NOT DETECTED NOT DETECTED Final   Chlamydophila pneumoniae NOT  DETECTED NOT DETECTED Final   Mycoplasma pneumoniae NOT DETECTED NOT DETECTED Final    Comment: Performed at [facility], [place]. [address] [place], [place] [address]  Blood Culture (routine x 2)     Status: None (Preliminary result)   Collection Time: 01/29/19  3:51 AM  Result Value Ref Range Status   Specimen Description BLOOD RIGHT HAND  Final   Special Requests   Final    BOTTLES DRAWN AEROBIC AND ANAEROBIC Blood Culture results may not be optimal due to an excessive volume of blood received in culture bottles   Culture   Final    NO GROWTH 1 DAY Performed at [facility], [place], [place]  [address]    Report Status PENDING  Incomplete  Blood Culture (routine x 2)     Status: None (Preliminary result)   Collection Time: 01/29/19  3:51 AM  Result Value Ref Range Status   Specimen Description BLOOD LEFT ANTECUBITAL  Final   Special Requests   Final    BOTTLES DRAWN AEROBIC AND ANAEROBIC Blood Culture results may not be optimal due to an excessive volume of blood received in culture bottles   Culture   Final    NO GROWTH 1 DAY Performed at [facility], [address], [place], [place] [address]    Report Status PENDING  Incomplete    RADIOLOGY:  No results found.   Management plans discussed with the patient, family and they are in agreement.  CODE STATUS: Full Code   TOTAL TIME TAKING CARE OF THIS PATIENT: 25 minutes.    Demetrios Loll M.D on 01/30/2019 at 10:46 AM  Between 7am to 6pm - Pager - [phone]  After 6pm go to [email or web address] - password [email or web address]  [facility] [place] [facility]  Office  [phone]  CC: Primary care physician; Adin Hector, MD   Note: This dictation was prepared with Dragon dictation along with smaller phrase technology. Any transcriptional errors that result from this process are unintentional.

## 2019-02-03 LAB — CULTURE, BLOOD (ROUTINE X 2)
Culture: NO GROWTH
Culture: NO GROWTH

## 2020-01-23 ENCOUNTER — Encounter: Payer: BC Managed Care – PPO | Admitting: Dermatology

## 2020-03-26 ENCOUNTER — Ambulatory Visit: Payer: BC Managed Care – PPO | Admitting: Dermatology

## 2020-03-26 ENCOUNTER — Other Ambulatory Visit: Payer: Self-pay

## 2020-03-26 DIAGNOSIS — D1800 Hemangioma unspecified site: Secondary | ICD-10-CM

## 2020-03-26 DIAGNOSIS — Z85820 Personal history of malignant melanoma of skin: Secondary | ICD-10-CM | POA: Diagnosis not present

## 2020-03-26 DIAGNOSIS — Z1283 Encounter for screening for malignant neoplasm of skin: Secondary | ICD-10-CM

## 2020-03-26 DIAGNOSIS — L814 Other melanin hyperpigmentation: Secondary | ICD-10-CM

## 2020-03-26 DIAGNOSIS — D229 Melanocytic nevi, unspecified: Secondary | ICD-10-CM | POA: Diagnosis not present

## 2020-03-26 DIAGNOSIS — L820 Inflamed seborrheic keratosis: Secondary | ICD-10-CM

## 2020-03-26 DIAGNOSIS — L719 Rosacea, unspecified: Secondary | ICD-10-CM

## 2020-03-26 DIAGNOSIS — L578 Other skin changes due to chronic exposure to nonionizing radiation: Secondary | ICD-10-CM

## 2020-03-26 DIAGNOSIS — L821 Other seborrheic keratosis: Secondary | ICD-10-CM

## 2020-03-26 DIAGNOSIS — L918 Other hypertrophic disorders of the skin: Secondary | ICD-10-CM

## 2020-03-26 NOTE — Progress Notes (Signed)
° °  Follow-Up Visit   Subjective  Brandon Caldwell is a 49 y.o. male who presents for the following: Annual Exam (TBSE, HX of Melanoma )Pt c/o irritated mole on his L arm.   The patient presents for Total-Body Skin Exam (TBSE) for skin cancer screening and mole check.  The following portions of the chart were reviewed this encounter and updated as appropriate:  Tobacco   Allergies   Meds   Problems   Med Hx   Surg Hx   Fam Hx       Review of Systems:  No other skin or systemic complaints except as noted in HPI or Assessment and Plan.  Objective  Well appearing patient in no apparent distress; mood and affect are within normal limits.  A full examination was performed including scalp, head, eyes, ears, nose, lips, neck, chest, axillae, abdomen, back, buttocks, bilateral upper extremities, bilateral lower extremities, hands, feet, fingers, toes, fingernails, and toenails. All findings within normal limits unless otherwise noted below.  Objective  R UQA periumbilical: Well healed scar with no evidence of recurrence, no lymphadenopathy.   Objective  face: Mid face erythema with telangiectasias +/- scattered inflammatory papules.   Objective  Left tricep: Erythematous keratotic or waxy stuck-on papule or plaque.    Assessment & Plan    History of melanoma R UQA periumbilical  Observe Recommend daily broad spectrum sunscreen SPF 30+ to sun-exposed areas, reapply every 2 hours as needed. Call for new or changing lesions.   Rosacea face  Mild Rosacea  No treatment needed   Inflamed seborrheic keratosis Left tricep  Destruction of lesion - Left tricep Complexity: simple   Destruction method: cryotherapy   Informed consent: discussed and consent obtained   Timeout:  patient name, date of birth, surgical site, and procedure verified Lesion destroyed using liquid nitrogen: Yes   Region frozen until ice ball extended beyond lesion: Yes   Outcome: patient tolerated  procedure well with no complications   Post-procedure details: wound care instructions given    Skin cancer screening   Lentigines - Scattered tan macules - Discussed due to sun exposure - Benign, observe - Call for any changes  Seborrheic Keratoses - Stuck-on, waxy, tan-brown papules and plaques  - Discussed benign etiology and prognosis. - Observe - Call for any changes  Melanocytic Nevi - Tan-brown and/or pink-flesh-colored symmetric macules and papules - Benign appearing on exam today - Observation - Call clinic for new or changing moles - Recommend daily use of broad spectrum spf 30+ sunscreen to sun-exposed areas.   Hemangiomas - Red papules - Discussed benign nature - Observe - Call for any changes  Actinic Damage - diffuse scaly erythematous macules with underlying dyspigmentation - Recommend daily broad spectrum sunscreen SPF 30+ to sun-exposed areas, reapply every 2 hours as needed.  - Call for new or changing lesions.  Skin cancer screening performed today.  Acrochordons (Skin Tags) - Fleshy, skin-colored pedunculated papules - Benign appearing.  - Observe. - If desired, they can be removed with an in office procedure that is not covered by insurance. - Please call the clinic if you notice any new or changing lesions. Return in about 6 months (around 09/25/2020) for TBSE, Hx of Melanoma .   IMarye Round, CMA, am acting as scribe for Sarina Ser, MD .  Documentation: I have reviewed the above documentation for accuracy and completeness, and I agree with the above.  Sarina Ser, MD

## 2020-03-26 NOTE — Patient Instructions (Signed)

## 2020-04-07 ENCOUNTER — Encounter: Payer: Self-pay | Admitting: Dermatology

## 2020-06-16 IMAGING — CR CHEST - 2 VIEW
1 series · 2 of 2 positions shown · non-contrast
Comparison: 01/24/2005

CLINICAL DATA: Awoke from a nap with LEFT chest pain, sharp pain
radiating to LEFT arm, history of GERD

EXAM:
CHEST - 2 VIEW

[Series 1: dg chest 2 view · 0.14mm/px · 2 of 2 slices shown]
[im 1/2]
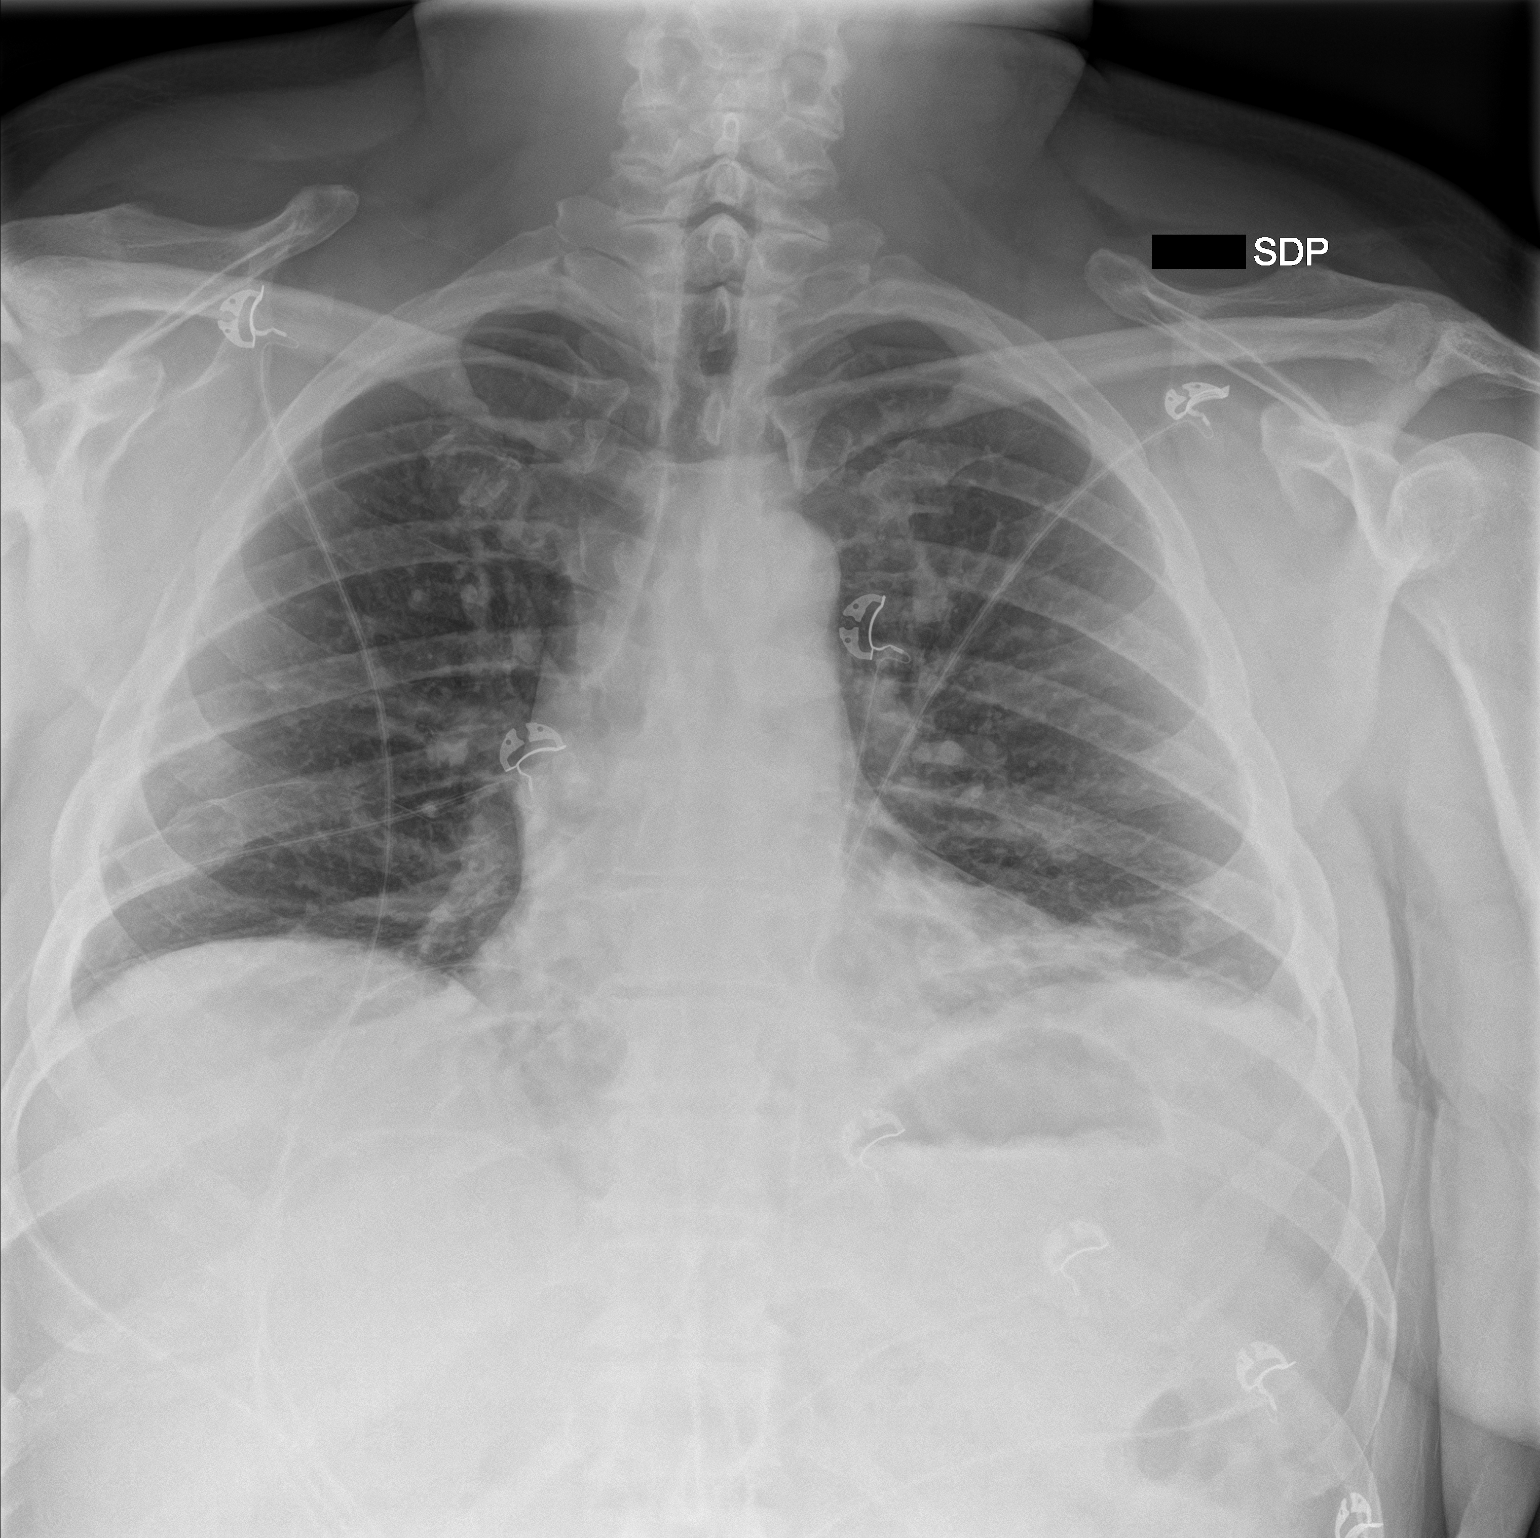
[im 2/2]
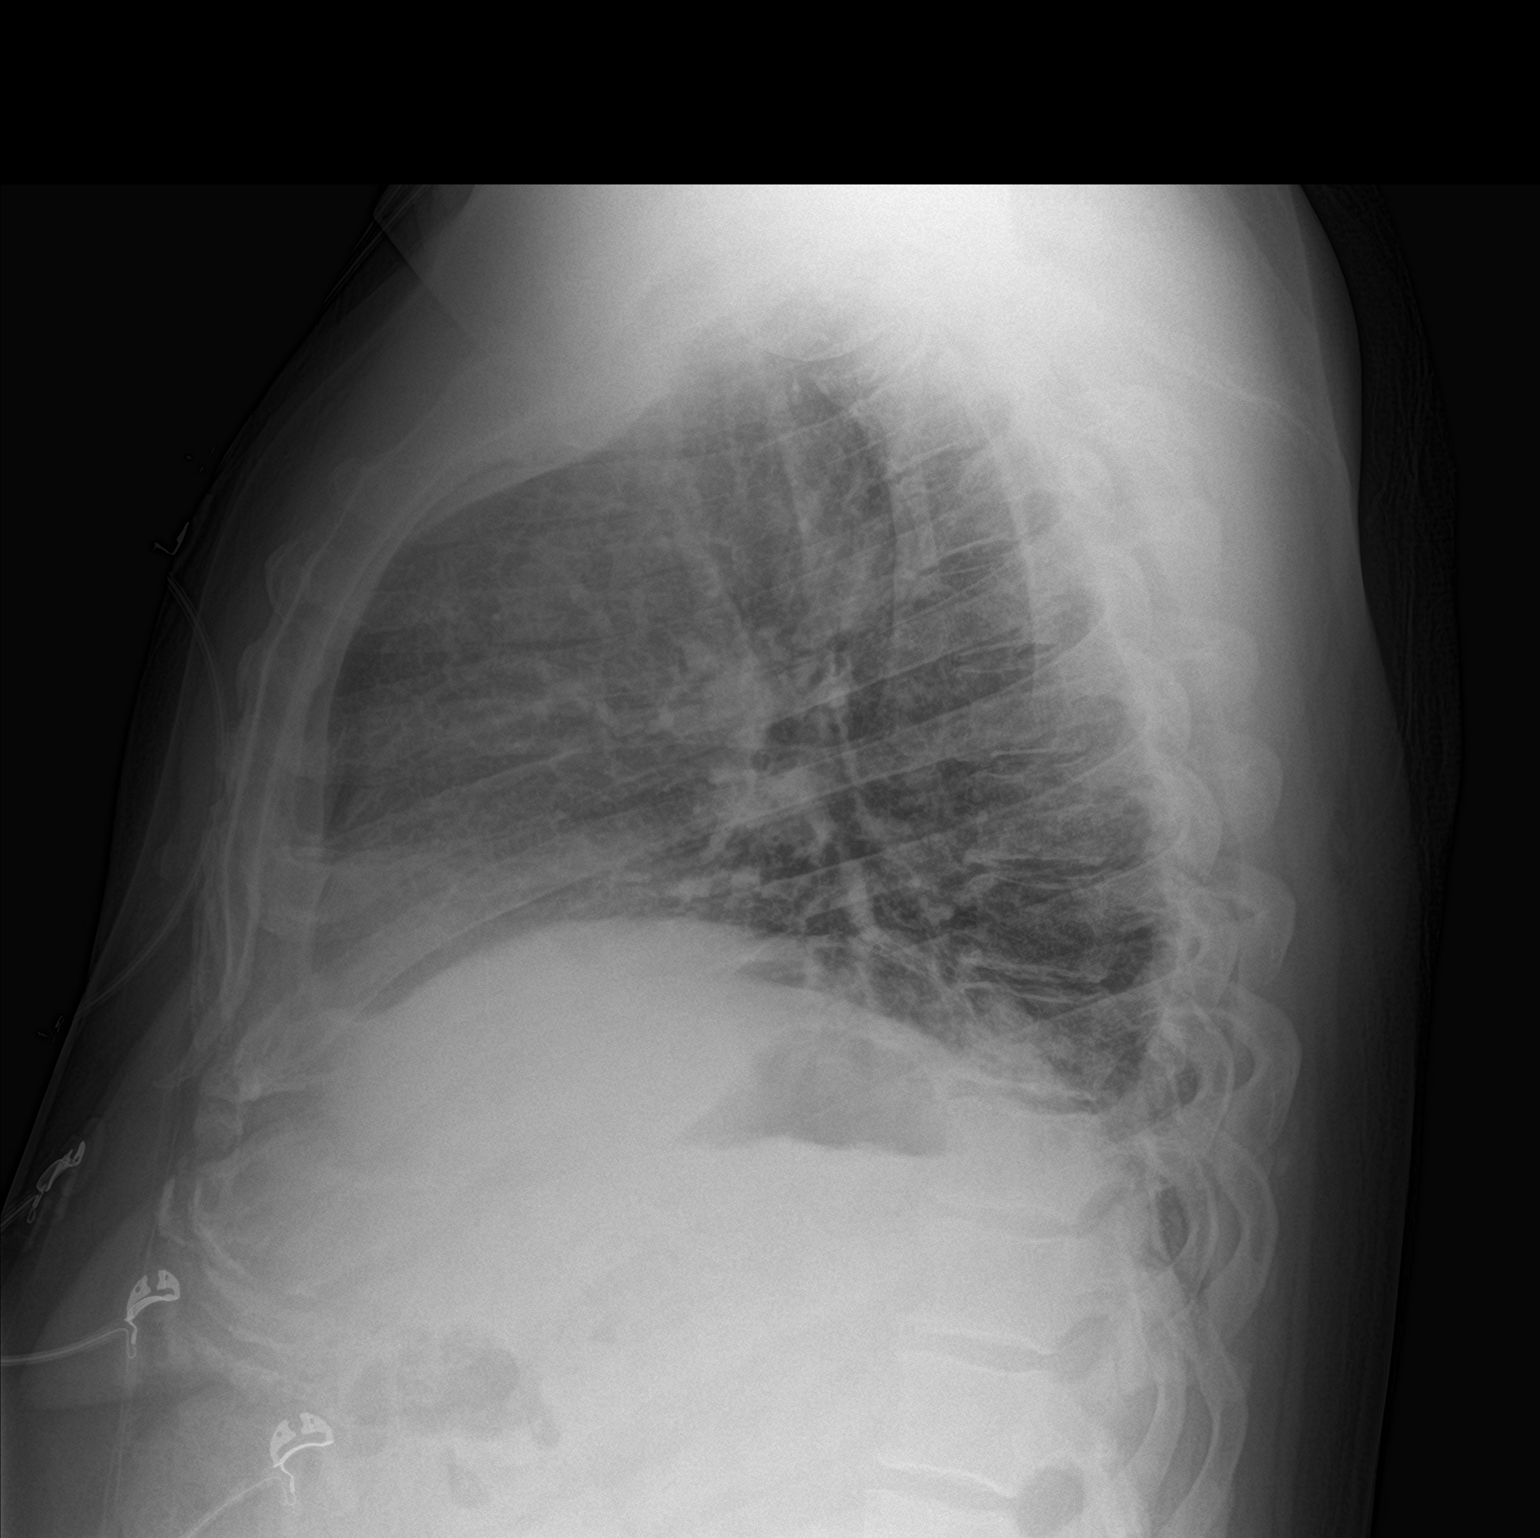

[2 of 2 positions shown; findings below may reference images not displayed]

FINDINGS: Upper normal heart size.

Mediastinal contours and pulmonary vascularity normal.

Decreased lung volumes with bibasilar atelectasis.

Lungs otherwise clear.

No infiltrate, pleural effusion or pneumothorax.

Old appearing fracture of the posterior RIGHT seventh rib.
IMPRESSION: Bibasilar atelectasis.

## 2020-06-16 IMAGING — CT CT ANGIOGRAPHY CHEST
2 of 12 series · 12 of 46 positions shown · IV contrast (APPLIED)
Comparison: None.

CLINICAL DATA: Right chest pain.

EXAM:
CT ANGIOGRAPHY CHEST WITH CONTRAST
TECHNIQUE: Multidetector CT imaging of the chest was performed using the
standard protocol during bolus administration of intravenous
contrast. Multiplanar CT image reconstructions and MIPs were
obtained to evaluate the vascular anatomy.
CONTRAST:  143 mL OMNIPAQUE IOHEXOL 350 MG/ML SOLN

[Series 14: thins · axial · 0.79mm/px · z∈[-720,-478]mm · 11 of 292 slices shown]
[im 25/292  lung]
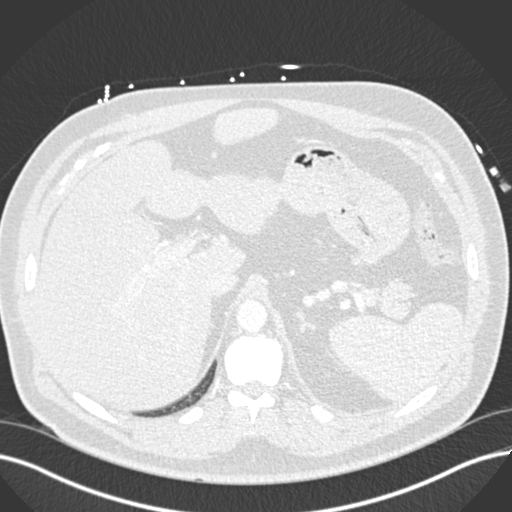
[im 49/292  soft-tissue]
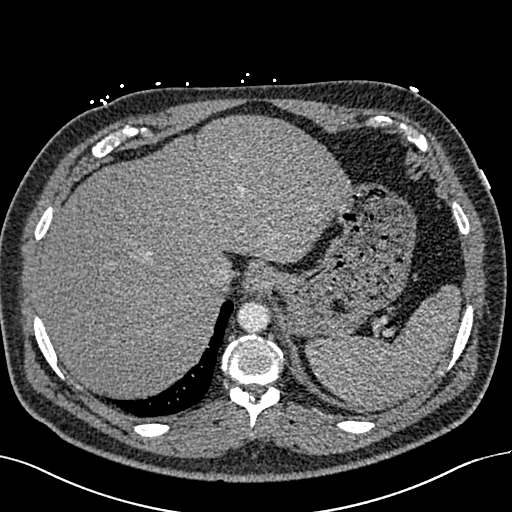
[im 73/292  lung]
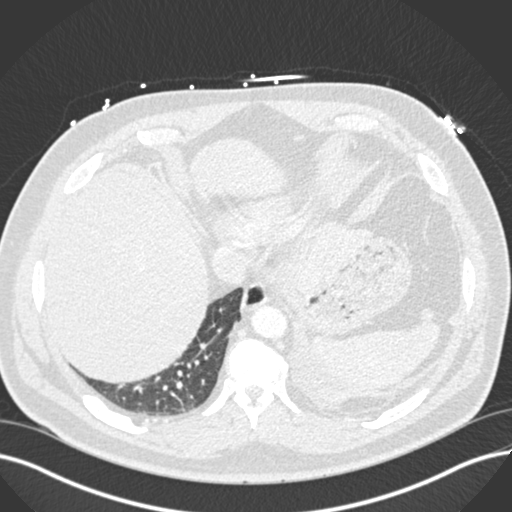
[im 98/292  soft-tissue]
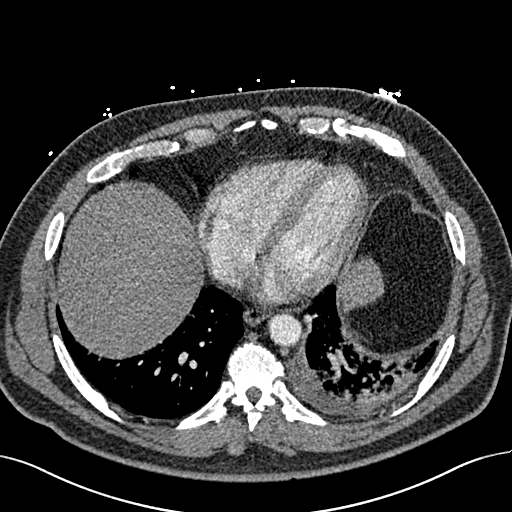
[im 122/292  lung]
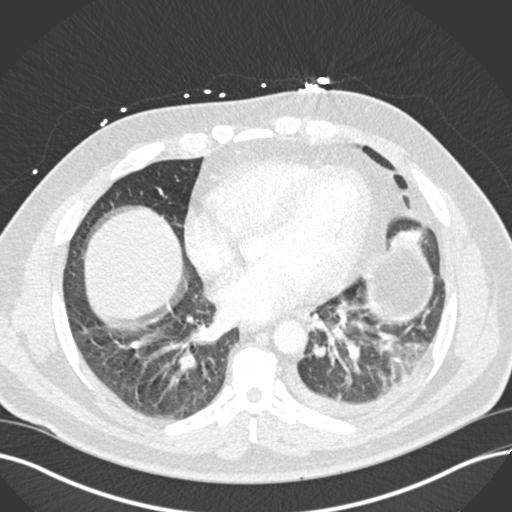
[im 146/292  soft-tissue]
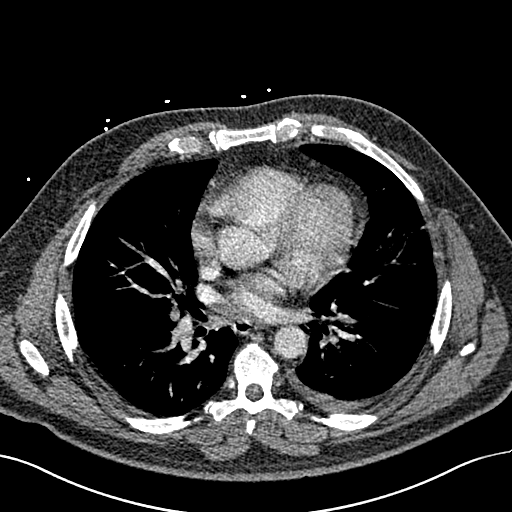
[im 170/292  lung]
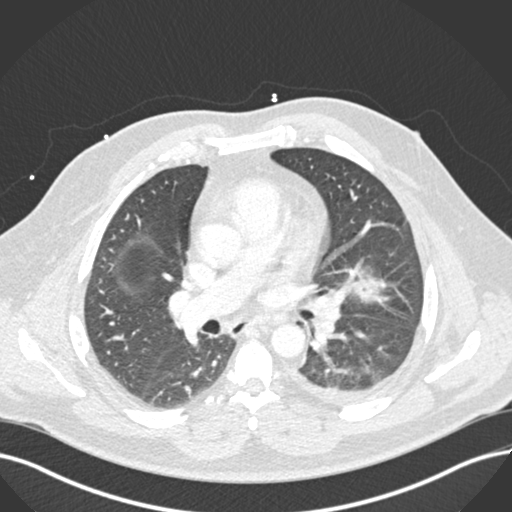
[im 195/292  soft-tissue]
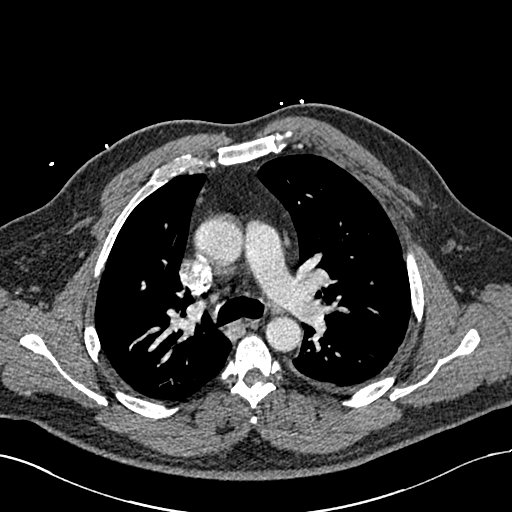
[im 219/292  lung]
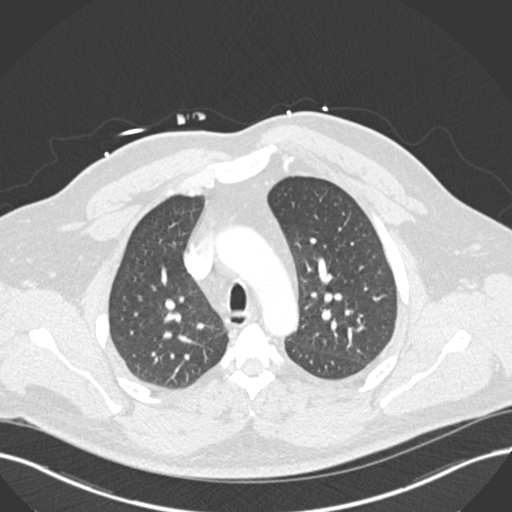
[im 243/292  soft-tissue]
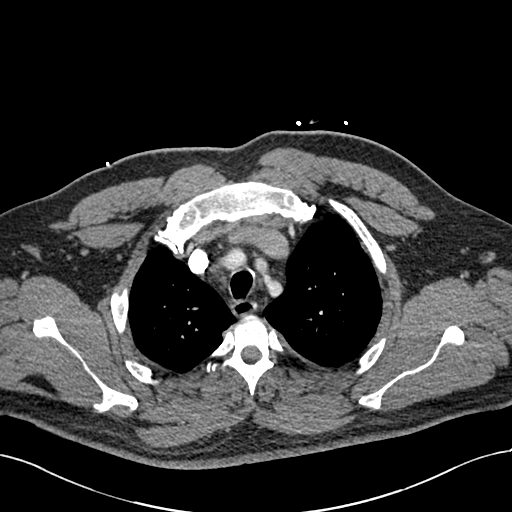
[im 267/292  lung]
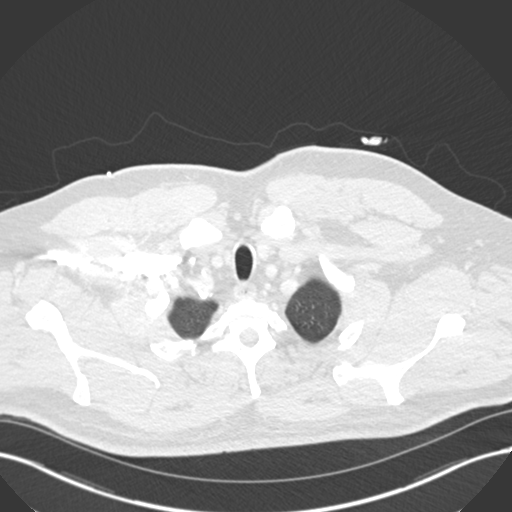

[Series 16: coronal mpr · coronal · 0.60mm/px · 1 of 99 slices shown]
[im 50/99  soft-tissue]
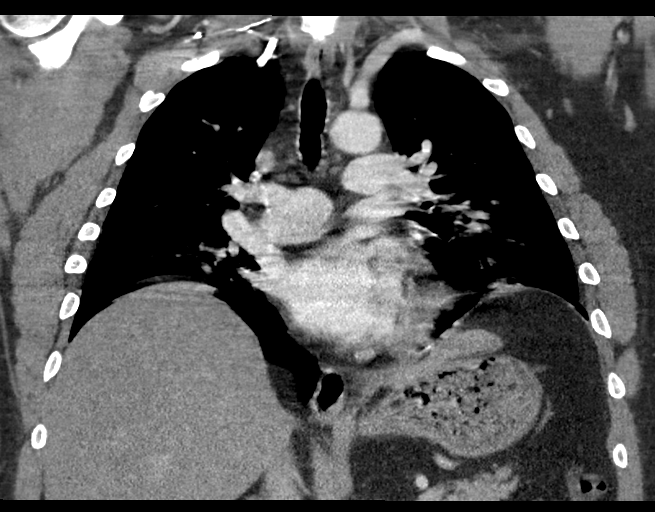

[12 of 46 positions shown; findings below may reference images not displayed]

FINDINGS: Cardiovascular: Image quality is decreased due to respiratory
motion. The patient was unable to hold breath. No visible pulmonary
embolus. The vessels in the lower lobes are obscured by respiratory
motion. Heart is borderline in size. Aorta normal caliber.

Mediastinum/Nodes: No mediastinal, hilar, or axillary adenopathy.

Lungs/Pleura: Left lower lobe airspace disease concerning for
pneumonia. Small left pleural effusion. Lingular atelectasis or
infiltrate also present at the left lung base. No focal opacity on
the right.

Upper Abdomen: Diffuse low-density throughout the liver compatible
with fatty infiltration. No acute findings

Musculoskeletal: Chest wall soft tissues are unremarkable.

Review of the MIP images confirms the above findings.
IMPRESSION: Decreased image quality due to respiratory motion. No visible
pulmonary embolus.

Left lower lobe and lingular airspace disease at the left lung base
concerning for pneumonia. Small left pleural effusion.

Fatty infiltration of the liver.

## 2020-10-01 ENCOUNTER — Ambulatory Visit: Payer: BC Managed Care – PPO | Admitting: Dermatology

## 2020-11-13 ENCOUNTER — Ambulatory Visit: Payer: BC Managed Care – PPO | Admitting: Dermatology

## 2020-11-13 ENCOUNTER — Other Ambulatory Visit: Payer: Self-pay

## 2020-11-13 ENCOUNTER — Encounter: Payer: Self-pay | Admitting: Dermatology

## 2020-11-13 DIAGNOSIS — L738 Other specified follicular disorders: Secondary | ICD-10-CM | POA: Diagnosis not present

## 2020-11-13 DIAGNOSIS — D229 Melanocytic nevi, unspecified: Secondary | ICD-10-CM

## 2020-11-13 DIAGNOSIS — L814 Other melanin hyperpigmentation: Secondary | ICD-10-CM

## 2020-11-13 DIAGNOSIS — L409 Psoriasis, unspecified: Secondary | ICD-10-CM | POA: Diagnosis not present

## 2020-11-13 DIAGNOSIS — L820 Inflamed seborrheic keratosis: Secondary | ICD-10-CM

## 2020-11-13 DIAGNOSIS — Z1283 Encounter for screening for malignant neoplasm of skin: Secondary | ICD-10-CM | POA: Diagnosis not present

## 2020-11-13 DIAGNOSIS — L578 Other skin changes due to chronic exposure to nonionizing radiation: Secondary | ICD-10-CM

## 2020-11-13 DIAGNOSIS — L918 Other hypertrophic disorders of the skin: Secondary | ICD-10-CM

## 2020-11-13 DIAGNOSIS — Z85820 Personal history of malignant melanoma of skin: Secondary | ICD-10-CM

## 2020-11-13 DIAGNOSIS — L821 Other seborrheic keratosis: Secondary | ICD-10-CM

## 2020-11-13 DIAGNOSIS — D1800 Hemangioma unspecified site: Secondary | ICD-10-CM

## 2020-11-13 NOTE — Progress Notes (Signed)
Follow-Up Visit   Subjective  Brandon Caldwell is a 50 y.o. male who presents for the following: Annual Exam (Hx MM ). 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 He had been on Otezla in the past and it did a great job with it he could not afford the medication. He would like to consider getting back on Otezla as this is the only medication that has helped him. The psoriatic changes of his fingers and nails make it difficult to work with his hands and decreases his ability to do his work and decreases his quality of life. He also has rough spots on his arms would like checked.  The patient presents for Total-Body Skin Exam (TBSE) for skin cancer screening and mole check.  The following portions of the chart were reviewed this encounter and updated as appropriate:   Tobacco  Allergies  Meds  Problems  Med Hx  Surg Hx  Fam Hx     Review of Systems:  No other skin or systemic complaints except as noted in HPI or Assessment and Plan.  Objective  Well appearing patient in no apparent distress; mood and affect are within normal limits.  A full examination was performed including scalp, head, eyes, ears, nose, lips, neck, chest, axillae, abdomen, back, buttocks, bilateral upper extremities, bilateral lower extremities, hands, feet, fingers, toes, fingernails, and toenails. All findings within normal limits unless otherwise noted below.  Objective  Face: Small yellow papules with a central dell.   Objective  arms and hands x 17 (17): Erythematous keratotic or waxy stuck-on papule or plaque.   Objective  B/L fingernails: Nail dystrophy of fingernails and splitting and kneeling and hyperkeratosis of the fingertips  Images        Assessment & Plan  Sebaceous hyperplasia of face Face  Benign-appearing.  Observation.  Call clinic for new or changing lesions.  Recommend daily use of broad spectrum spf 30+ sunscreen to sun-exposed areas.     Inflamed seborrheic keratosis (17) arms and hands x 17  Destruction of lesion - arms and hands x 17 Complexity: simple   Destruction method: cryotherapy   Informed consent: discussed and consent obtained   Timeout:  patient name, date of birth, surgical site, and procedure verified Lesion destroyed using liquid nitrogen: Yes   Region frozen until ice ball extended beyond lesion: Yes   Outcome: patient tolerated procedure well with no complications   Post-procedure details: wound care instructions given    Psoriasis B/L fingers and fingernails Psoriatic hand/fingers and nail disease - Per patient the only thing that helped in the past was [facility], patient would like to restart treatment. Effects works and quality of life. BSA - 2%   Psoriasis is a chronic non-curable, but treatable genetic/hereditary disease that may have other systemic features affecting other organ systems such as joints (Psoriatic Arthritis). It is associated with an increased risk of inflammatory bowel disease, heart disease, non-alcoholic fatty liver disease, and depression.    Sample pack given of Otezla - titrate up to 30mg  po QD  Skin cancer screening  Lentigines - Scattered tan macules - Discussed due to sun exposure - Benign, observe - Call for any changes  Seborrheic Keratoses - Stuck-on, waxy, tan-brown papules and plaques  - Discussed benign etiology and prognosis. - Observe - Call for any changes  Melanocytic Nevi - Tan-brown and/or pink-flesh-colored symmetric macules and papules - Benign appearing on exam today - Observation - Call clinic for new or changing  moles - Recommend daily use of broad spectrum spf 30+ sunscreen to sun-exposed areas.   Hemangiomas - Red papules - Discussed benign nature - Observe - Call for any changes  Actinic Damage - Chronic, secondary to cumulative UV/sun exposure - diffuse scaly erythematous macules with underlying dyspigmentation - Recommend daily  broad spectrum sunscreen SPF 30+ to sun-exposed areas, reapply every 2 hours as needed.  - Call for new or changing lesions.  History of Melanoma -RUQA periumbilical  - No evidence of recurrence today - No lymphadenopathy - Recommend regular full body skin exams - Recommend daily broad spectrum sunscreen SPF 30+ to sun-exposed areas, reapply every 2 hours as needed.  - Call if any new or changing lesions are noted between office visits  Acrochordons (Skin Tags) - Fleshy, skin-colored pedunculated papules - Benign appearing.  - Observe. - If desired, they can be removed with an in office procedure that is not covered by insurance. - Please call the clinic if you notice any new or changing lesions.  Skin cancer screening performed today.  Return in about 6 months (around 05/13/2021) for TBSE - Hx MM; 1 month psoriasis follow up .  Luther Redo, CMA, am acting as scribe for Sarina Ser, MD .  Documentation: I have reviewed the above documentation for accuracy and completeness, and I agree with the above.  Sarina Ser, MD

## 2020-11-16 ENCOUNTER — Encounter: Payer: Self-pay | Admitting: Dermatology

## 2020-11-28 ENCOUNTER — Other Ambulatory Visit: Payer: Self-pay | Admitting: Dermatology

## 2020-12-11 ENCOUNTER — Ambulatory Visit: Payer: BC Managed Care – PPO | Admitting: Dermatology

## 2021-01-20 ENCOUNTER — Telehealth: Payer: Self-pay | Admitting: *Deleted

## 2021-01-20 ENCOUNTER — Encounter: Payer: Self-pay | Admitting: *Deleted

## 2021-01-20 DIAGNOSIS — Z87891 Personal history of nicotine dependence: Secondary | ICD-10-CM

## 2021-01-20 DIAGNOSIS — Z122 Encounter for screening for malignant neoplasm of respiratory organs: Secondary | ICD-10-CM

## 2021-01-20 NOTE — Telephone Encounter (Signed)
Smoking history update. 2 ppd x 20 years, former smoker.

## 2021-01-20 NOTE — Telephone Encounter (Signed)
Received referral for initial lung cancer screening scan. Contacted patient and obtained smoking history,(former smoker, 2 ppd x 30 yrs) as well as answering questions related to screening process. Patient denies signs of lung cancer such as weight loss or hemoptysis. Patient denies comorbidity that would prevent curative treatment if lung cancer were found. Patient is scheduled for CT scan on 02/05/21 @ 4:30pm.

## 2021-01-29 ENCOUNTER — Ambulatory Visit: Payer: BC Managed Care – PPO | Admitting: Dermatology

## 2021-01-29 ENCOUNTER — Other Ambulatory Visit: Payer: Self-pay

## 2021-01-29 DIAGNOSIS — L409 Psoriasis, unspecified: Secondary | ICD-10-CM | POA: Diagnosis not present

## 2021-01-29 NOTE — Progress Notes (Signed)
   Follow-Up Visit   Subjective  Brandon Caldwell is a 50 y.o. male who presents for the following: Psoriasis (Follow up - Much improved - nails are almost back to normal. He is taking Otezla 30 mg 1 po bid and is not having any side effects).  The following portions of the chart were reviewed this encounter and updated as appropriate:   Tobacco  Allergies  Meds  Problems  Med Hx  Surg Hx  Fam Hx     Review of Systems:  No other skin or systemic complaints except as noted in HPI or Assessment and Plan.  Objective  Well appearing patient in no apparent distress; mood and affect are within normal limits.  A focused examination was performed including face, neck, chest and back and hands. Relevant physical exam findings are noted in the Assessment and Plan.  Objective  Right 3rd Finger Tip: Minimal fingertip peeling and some nail dystrophy   Assessment & Plan  Psoriasis - severe of hands; fingers and nails Right 3rd Finger Tip Chronic and persistent but much improved Continue Otezla 30 mg 1 po bid Will plan follow up photos on follow up  Psoriasis is a chronic non-curable, but treatable genetic/hereditary disease that may have other systemic features affecting other organ systems such as joints (Psoriatic Arthritis). It is associated with an increased risk of inflammatory bowel disease, heart disease, non-alcoholic fatty liver disease, and depression.    Return in about 4 months (around 06/01/2021) for Psoriasis.   I, Ashok Cordia, CMA, am acting as scribe for Sarina Ser, MD .  Documentation: I have reviewed the above documentation for accuracy and completeness, and I agree with the above.  Sarina Ser, MD

## 2021-01-29 NOTE — Patient Instructions (Signed)

## 2021-02-02 ENCOUNTER — Encounter: Payer: Self-pay | Admitting: Dermatology

## 2021-02-05 ENCOUNTER — Ambulatory Visit: Admission: RE | Admit: 2021-02-05 | Payer: BC Managed Care – PPO | Source: Ambulatory Visit

## 2021-05-11 ENCOUNTER — Other Ambulatory Visit: Payer: Self-pay

## 2021-05-11 DIAGNOSIS — L409 Psoriasis, unspecified: Secondary | ICD-10-CM

## 2021-05-11 MED ORDER — OTEZLA 30 MG PO TABS: 1.0000 | ORAL_TABLET | Freq: Two times a day (BID) | ORAL | 0 refills | Status: DC

## 2021-06-10 ENCOUNTER — Ambulatory Visit: Payer: BC Managed Care – PPO | Admitting: Dermatology

## 2021-06-11 ENCOUNTER — Other Ambulatory Visit: Payer: Self-pay

## 2021-06-11 DIAGNOSIS — L409 Psoriasis, unspecified: Secondary | ICD-10-CM

## 2021-06-11 MED ORDER — OTEZLA 30 MG PO TABS: 1.0000 | ORAL_TABLET | Freq: Two times a day (BID) | ORAL | 0 refills | Status: DC

## 2021-06-11 NOTE — Progress Notes (Signed)
[facility] N[facility] RF

## 2021-06-15 ENCOUNTER — Other Ambulatory Visit: Payer: Self-pay

## 2021-06-15 ENCOUNTER — Ambulatory Visit: Payer: BC Managed Care – PPO | Admitting: Dermatology

## 2021-06-15 DIAGNOSIS — L409 Psoriasis, unspecified: Secondary | ICD-10-CM

## 2021-06-15 MED ORDER — OTEZLA 30 MG PO TABS: 1.0000 | ORAL_TABLET | Freq: Two times a day (BID) | ORAL | 6 refills | Status: DC

## 2021-06-15 NOTE — Progress Notes (Signed)
   Follow-Up Visit   Subjective  Brandon Caldwell is a 50 y.o. male who presents for the following: Psoriasis (4 months f/u on Psoriasis on the hands, taking Otezla 30 mg bid ). Pt recently moved to [place].   The following portions of the chart were reviewed this encounter and updated as appropriate:   Tobacco  Allergies  Meds  Problems  Med Hx  Surg Hx  Fam Hx     Review of Systems:  No other skin or systemic complaints except as noted in HPI or Assessment and Plan.  Objective  Well appearing patient in no apparent distress; mood and affect are within normal limits.  A focused examination was performed including face,hands. Relevant physical exam findings are noted in the Assessment and Plan.  bilateral hands Thick plaque at the right knuckle, mainly clear, nails clear             Assessment & Plan  Psoriasis -much improved on oral systemic Otezla.  Patient is very pleased.  No side effects bilateral hands  Psoriasis is a chronic non-curable, but treatable genetic/hereditary disease that may have other systemic features affecting other organ systems such as joints (Psoriatic Arthritis). It is associated with an increased risk of inflammatory bowel disease, heart disease, non-alcoholic fatty liver disease, and depression.      Side effects of Otezla (apremilast) include diarrhea, nausea, headache, upper respiratory infection, depression, and weight decrease (5-10%). Pt decline side effects. Goal is control of skin condition, not cure.  The use of [place] requires long term medication management, including periodic office visits.   Cont Otezla 30 mg bid   Discussed with pt he could see a doctor in [place] (OTEZLA) 30 MG TABS Take 1 tablet (30 mg total) by mouth 2 (two) times daily.  Patient may follow-up in 6 months.  If he finds a dermatologist in [place] where he recently moved he may follow-up  there.  IMarye Round, CMA, am acting as scribe for Sarina Ser, MD .  Documentation: I have reviewed the above documentation for accuracy and completeness, and I agree with the above.  Sarina Ser, MD

## 2021-06-15 NOTE — Patient Instructions (Signed)

## 2021-06-17 ENCOUNTER — Encounter: Payer: Self-pay | Admitting: Dermatology

## 2021-10-29 ENCOUNTER — Other Ambulatory Visit: Payer: Self-pay

## 2021-10-29 DIAGNOSIS — L409 Psoriasis, unspecified: Secondary | ICD-10-CM

## 2021-10-29 MED ORDER — OTEZLA 30 MG PO TABS: 1.0000 | ORAL_TABLET | Freq: Two times a day (BID) | ORAL | 2 refills | Status: DC

## 2021-12-16 ENCOUNTER — Ambulatory Visit: Payer: BC Managed Care – PPO | Admitting: Dermatology

## 2022-04-22 ENCOUNTER — Ambulatory Visit: Payer: BC Managed Care – PPO | Admitting: Dermatology

## 2022-07-26 ENCOUNTER — Other Ambulatory Visit: Payer: Self-pay

## 2022-07-26 DIAGNOSIS — L409 Psoriasis, unspecified: Secondary | ICD-10-CM

## 2022-07-26 MED ORDER — OTEZLA 30 MG PO TABS: 1.0000 | ORAL_TABLET | Freq: Two times a day (BID) | ORAL | 2 refills | Status: DC

## 2022-07-26 NOTE — Progress Notes (Signed)
Otezla RFs. Patient had to reschedule and first available is February. aw

## 2022-10-08 ENCOUNTER — Other Ambulatory Visit: Payer: Self-pay | Admitting: Dermatology

## 2022-10-08 DIAGNOSIS — L409 Psoriasis, unspecified: Secondary | ICD-10-CM

## 2022-10-28 ENCOUNTER — Ambulatory Visit: Payer: BC Managed Care – PPO | Admitting: Dermatology

## 2022-10-28 VITALS — BP 122/101

## 2022-10-28 DIAGNOSIS — Z1283 Encounter for screening for malignant neoplasm of skin: Secondary | ICD-10-CM

## 2022-10-28 DIAGNOSIS — L719 Rosacea, unspecified: Secondary | ICD-10-CM

## 2022-10-28 DIAGNOSIS — L814 Other melanin hyperpigmentation: Secondary | ICD-10-CM

## 2022-10-28 DIAGNOSIS — L578 Other skin changes due to chronic exposure to nonionizing radiation: Secondary | ICD-10-CM

## 2022-10-28 DIAGNOSIS — L409 Psoriasis, unspecified: Secondary | ICD-10-CM

## 2022-10-28 DIAGNOSIS — Z85820 Personal history of malignant melanoma of skin: Secondary | ICD-10-CM

## 2022-10-28 DIAGNOSIS — L821 Other seborrheic keratosis: Secondary | ICD-10-CM

## 2022-10-28 DIAGNOSIS — L918 Other hypertrophic disorders of the skin: Secondary | ICD-10-CM

## 2022-10-28 DIAGNOSIS — D229 Melanocytic nevi, unspecified: Secondary | ICD-10-CM

## 2022-10-28 DIAGNOSIS — Z79899 Other long term (current) drug therapy: Secondary | ICD-10-CM

## 2022-10-28 MED ORDER — DOXYCYCLINE HYCLATE 20 MG PO TABS: 20.0000 mg | ORAL_TABLET | Freq: Every day | ORAL | 5 refills | Status: DC

## 2022-10-28 NOTE — Patient Instructions (Signed)
Due to recent changes in healthcare laws, you may see results of your pathology and/or laboratory studies on MyChart before the doctors have had a chance to review them. We understand that in some cases there may be results that are confusing or concerning to you. Please understand that not all results are received at the same time and often the doctors may need to interpret multiple results in order to provide you with the best plan of care or course of treatment. Therefore, we ask that you please give us 2 business days to thoroughly review all your results before contacting the office for clarification. Should we see a critical lab result, you will be contacted sooner.   If You Need Anything After Your Visit  If you have any questions or concerns for your doctor, please call our main line at [phone] and press option 4 to reach your doctor's medical assistant. If no one answers, please leave a voicemail as directed and we will return your call as soon as possible. Messages left after 4 pm will be answered the following business day.   You may also send us a message via MyChart. We typically respond to MyChart messages within 1-2 business days.  For prescription refills, please ask your pharmacy to contact our office. Our fax number is [phone].  If you have an urgent issue when the clinic is closed that cannot wait until the next business day, you can page your doctor at the number below.    Please note that while we do our best to be available for urgent issues outside of office hours, we are not available 24/7.   If you have an urgent issue and are unable to reach us, you may choose to seek medical care at your doctor's office, retail clinic, urgent care center, or emergency room.  If you have a medical emergency, please immediately call 911 or go to the emergency department.  Pager Numbers  - Dr. Kowalski: [phone]  - Dr. Moye: [phone]  - Dr. Stewart:  [phone]  In the event of inclement weather, please call our main line at [phone] for an update on the status of any delays or closures.  Dermatology Medication Tips: Please keep the boxes that topical medications come in in order to help keep track of the instructions about where and how to use these. Pharmacies typically print the medication instructions only on the boxes and not directly on the medication tubes.   If your medication is too expensive, please contact our office at [phone] option 4 or send us a message through MyChart.   We are unable to tell what your co-pay for medications will be in advance as this is different depending on your insurance coverage. However, we may be able to find a substitute medication at lower cost or fill out paperwork to get insurance to cover a needed medication.   If a prior authorization is required to get your medication covered by your insurance company, please allow us 1-2 business days to complete this process.  Drug prices often vary depending on where the prescription is filled and some pharmacies may offer cheaper prices.  The website www.goodrx.com contains coupons for medications through different pharmacies. The prices here do not account for what the cost may be with help from insurance (it may be cheaper with your insurance), but the website can give you the price if you did not use any insurance.  - You can print the associated coupon and take it with   your prescription to the pharmacy.  - You may also stop by our office during regular business hours and pick up a GoodRx coupon card.  - If you need your prescription sent electronically to a different pharmacy, notify our office through [facility] MyChart or by phone at [phone] option 4.     Si Usted Necesita Algo Despus de Su Visita  Tambin puede enviarnos un mensaje a travs de MyChart. Por lo general respondemos a los mensajes de MyChart en el transcurso de 1 a 2  das hbiles.  Para renovar recetas, por favor pida a su farmacia que se ponga en contacto con nuestra oficina. Nuestro nmero de fax es el [phone].  Si tiene un asunto urgente cuando la clnica est cerrada y que no puede esperar hasta el siguiente da hbil, puede llamar/localizar a su doctor(a) al nmero que aparece a continuacin.   Por favor, tenga en cuenta que aunque hacemos todo lo posible para estar disponibles para asuntos urgentes fuera del horario de oficina, no estamos disponibles las 24 horas del da, los 7 das de la semana.   Si tiene un problema urgente y no puede comunicarse con nosotros, puede optar por buscar atencin mdica  en el consultorio de su doctor(a), en una clnica privada, en un centro de atencin urgente o en una sala de emergencias.  Si tiene una emergencia mdica, por favor llame inmediatamente al 911 o vaya a la sala de emergencias.  Nmeros de bper  - Dr. Kowalski: [phone]  - Dra. Moye: [phone]  - Dra. Stewart: [phone]  En caso de inclemencias del tiempo, por favor llame a nuestra lnea principal al [phone] para una actualizacin sobre el estado de cualquier retraso o cierre.  Consejos para la medicacin en dermatologa: Por favor, guarde las cajas en las que vienen los medicamentos de uso tpico para ayudarle a seguir las instrucciones sobre dnde y cmo usarlos. Las farmacias generalmente imprimen las instrucciones del medicamento slo en las cajas y no directamente en los tubos del medicamento.   Si su medicamento es muy caro, por favor, pngase en contacto con nuestra oficina llamando al [phone] y presione la opcin 4 o envenos un mensaje a travs de MyChart.   No podemos decirle cul ser su copago por los medicamentos por adelantado ya que esto es diferente dependiendo de la cobertura de su seguro. Sin embargo, es posible que podamos encontrar un medicamento sustituto a menor costo o llenar un formulario para que el  seguro cubra el medicamento que se considera necesario.   Si se requiere una autorizacin previa para que su compaa de seguros cubra su medicamento, por favor permtanos de 1 a 2 das hbiles para completar este proceso.  Los precios de los medicamentos varan con frecuencia dependiendo del lugar de dnde se surte la receta y alguna farmacias pueden ofrecer precios ms baratos.  El sitio web www.goodrx.com tiene cupones para medicamentos de diferentes farmacias. Los precios aqu no tienen en cuenta lo que podra costar con la ayuda del seguro (puede ser ms barato con su seguro), pero el sitio web puede darle el precio si no utiliz ningn seguro.  - Puede imprimir el cupn correspondiente y llevarlo con su receta a la farmacia.  - Tambin puede pasar por nuestra oficina durante el horario de atencin regular y recoger una tarjeta de cupones de GoodRx.  - Si necesita que su receta se enve electrnicamente a una farmacia diferente, informe a nuestra oficina a travs de MyChart de [facility]   o por telfono llamando al [phone] y presione la opcin 4.  

## 2022-10-28 NOTE — Progress Notes (Signed)
Follow-Up Visit   Subjective  Brandon Caldwell is a 52 y.o. male who presents for the following: Annual Exam (History of melanoma (2018) - The patient presents for Total-Body Skin Exam (TBSE) for skin cancer screening and mole check.  The patient has spots, moles and lesions to be evaluated, some may be new or changing and the patient has concerns that these could be cancer./) and Psoriasis (Otezla 30 mg 1 po qd-bid).  The following portions of the chart were reviewed this encounter and updated as appropriate:   Tobacco  Allergies  Meds  Problems  Med Hx  Surg Hx  Fam Hx     Review of Systems:  No other skin or systemic complaints except as noted in HPI or Assessment and Plan.  Objective  Well appearing patient in no apparent distress; mood and affect are within normal limits.  A full examination was performed including scalp, head, eyes, ears, nose, lips, neck, chest, axillae, abdomen, back, buttocks, bilateral upper extremities, bilateral lower extremities, hands, feet, fingers, toes, fingernails, and toenails. All findings within normal limits unless otherwise noted below.  Face Erythema of face and sclera  Left Axilla x 4, right axilla x 6 (10) Fleshy, skin-colored pedunculated papules.     Assessment & Plan   History of Melanoma - No evidence of recurrence today - No lymphadenopathy - Recommend regular full body skin exams - Recommend daily broad spectrum sunscreen SPF 30+ to sun-exposed areas, reapply every 2 hours as needed.  - Call if any new or changing lesions are noted between office visits;  Lentigines - Scattered tan macules - Due to sun exposure - Benign-appearing, observe - Recommend daily broad spectrum sunscreen SPF 30+ to sun-exposed areas, reapply every 2 hours as needed. - Call for any changes  Seborrheic Keratoses - Stuck-on, waxy, tan-brown papules and/or plaques  - Benign-appearing - Discussed benign etiology and prognosis. - Observe -  Call for any changes  Melanocytic Nevi - Tan-brown and/or pink-flesh-colored symmetric macules and papules - Benign appearing on exam today - Observation - Call clinic for new or changing moles - Recommend daily use of broad spectrum spf 30+ sunscreen to sun-exposed areas.   Hemangiomas - Red papules - Discussed benign nature - Observe - Call for any changes  Actinic Damage - Chronic condition, secondary to cumulative UV/sun exposure - diffuse scaly erythematous macules with underlying dyspigmentation - Recommend daily broad spectrum sunscreen SPF 30+ to sun-exposed areas, reapply every 2 hours as needed.  - Staying in the shade or wearing long sleeves, sun glasses (UVA+UVB protection) and wide brim hats (4-inch brim around the entire circumference of the hat) are also recommended for sun protection.  - Call for new or changing lesions.  Skin cancer screening performed today.  Psoriasis Counseling on psoriasis and coordination of care  psoriasis is a chronic non-curable, but treatable genetic/hereditary disease that may have other systemic features affecting other organ systems such as joints (Psoriatic Arthritis). It is associated with an increased risk of inflammatory bowel disease, heart disease, non-alcoholic fatty liver disease, and depression.  Treatments include light and laser treatments; topical medications; and systemic medications including oral and injectables.  Continue Otezla 30 mg 1 po qd-bid No side effects reported. Long term medication management.  Patient is using long term (months to years) prescription medication  to control their dermatologic condition.  These medications require periodic monitoring to evaluate for efficacy and side effects and may require periodic laboratory monitoring.   Related Medications OTEZLA  30 MG TABS TAKE 1 TABLET BY MOUTH 2 TIMES A DAY.  Rosacea Face With ocular rosacea Rosacea is a chronic progressive skin condition usually  affecting the face of adults, causing redness and/or acne bumps. It is treatable but not curable. It sometimes affects the eyes (ocular rosacea) as well. It may respond to topical and/or systemic medication and can flare with stress, sun exposure, alcohol, exercise, topical steroids (including hydrocortisone/cortisone 10) and some foods.  Daily application of broad spectrum spf 30+ sunscreen to face is recommended to reduce flares.  Start Doxycycline 20 mg 1 po qd with food and plenty of fluid  doxycycline (PERIOSTAT) 20 MG tablet - Face Take 1 tablet (20 mg total) by mouth daily. With food and plenty of fluid  Skin tag (10) Left Axilla x 4, right axilla x 6 Self pay fee - $115.00 Epidermal / dermal shaving - Left Axilla x 4, right axilla x 6  Informed consent: discussed and consent obtained   Patient was prepped and draped in usual sterile fashion: Area prepped with alcohol. Anesthesia: the lesion was anesthetized in a standard fashion   Anesthetic:  1% lidocaine w/ epinephrine 1-100,000 buffered w/ 8.4% NaHCO3 Instrument used: scissors   Hemostasis achieved with: pressure, aluminum chloride and electrodesiccation   Outcome: patient tolerated procedure well   Post-procedure details: wound care instructions given    Return in about 6 months (around 04/28/2023) for Rosacea follow up.  I, Ashok Cordia, CMA, am acting as scribe for Sarina Ser, MD . Documentation: I have reviewed the above documentation for accuracy and completeness, and I agree with the above.  Sarina Ser, MD

## 2022-11-02 ENCOUNTER — Encounter: Payer: Self-pay | Admitting: Dermatology

## 2022-11-22 ENCOUNTER — Other Ambulatory Visit: Payer: Self-pay

## 2022-11-22 DIAGNOSIS — L719 Rosacea, unspecified: Secondary | ICD-10-CM

## 2022-11-22 MED ORDER — DOXYCYCLINE HYCLATE 20 MG PO TABS: 20.0000 mg | ORAL_TABLET | Freq: Every day | ORAL | 1 refills | Status: DC

## 2022-12-09 ENCOUNTER — Telehealth: Payer: Self-pay

## 2022-12-09 DIAGNOSIS — L409 Psoriasis, unspecified: Secondary | ICD-10-CM

## 2022-12-09 MED ORDER — OTEZLA 30 MG PO TABS: 30.0000 mg | ORAL_TABLET | Freq: Two times a day (BID) | ORAL | 1 refills | Status: DC

## 2022-12-09 NOTE — Telephone Encounter (Signed)
[facility] left a message requesting a 90 supply of Otezla. RX sent in. aw

## 2023-03-30 ENCOUNTER — Other Ambulatory Visit: Payer: Self-pay

## 2023-03-30 DIAGNOSIS — L409 Psoriasis, unspecified: Secondary | ICD-10-CM

## 2023-03-30 MED ORDER — OTEZLA 30 MG PO TABS: 1.0000 | ORAL_TABLET | Freq: Two times a day (BID) | ORAL | 2 refills | Status: DC

## 2023-03-30 NOTE — Progress Notes (Signed)
Patient called requesting refills of Otezla 30 mg one tab po BID. Appointment scheduled and refills sent to pharmacy.

## 2023-04-06 ENCOUNTER — Other Ambulatory Visit: Payer: Self-pay

## 2023-04-06 DIAGNOSIS — L409 Psoriasis, unspecified: Secondary | ICD-10-CM

## 2023-04-06 MED ORDER — OTEZLA 30 MG PO TABS: 1.0000 | ORAL_TABLET | Freq: Two times a day (BID) | ORAL | 2 refills | Status: DC

## 2023-04-06 NOTE — Progress Notes (Signed)
[facility] called and left VM today for Otezla RF. RX was sent 03/30/23 but re sent today. aw

## 2023-04-27 ENCOUNTER — Other Ambulatory Visit: Payer: Self-pay

## 2023-04-27 DIAGNOSIS — L409 Psoriasis, unspecified: Secondary | ICD-10-CM

## 2023-04-27 MED ORDER — OTEZLA 30 MG PO TABS
1.0000 | ORAL_TABLET | Freq: Two times a day (BID) | ORAL | 0 refills | Status: DC
Start: 2023-04-27 — End: 2024-02-06

## 2023-05-05 ENCOUNTER — Ambulatory Visit: Payer: BC Managed Care – PPO | Admitting: Dermatology

## 2023-05-05 DIAGNOSIS — L719 Rosacea, unspecified: Secondary | ICD-10-CM | POA: Diagnosis not present

## 2023-05-05 DIAGNOSIS — Z79899 Other long term (current) drug therapy: Secondary | ICD-10-CM

## 2023-05-05 DIAGNOSIS — W908XXA Exposure to other nonionizing radiation, initial encounter: Secondary | ICD-10-CM

## 2023-05-05 DIAGNOSIS — L817 Pigmented purpuric dermatosis: Secondary | ICD-10-CM

## 2023-05-05 DIAGNOSIS — I872 Venous insufficiency (chronic) (peripheral): Secondary | ICD-10-CM | POA: Diagnosis not present

## 2023-05-05 DIAGNOSIS — L578 Other skin changes due to chronic exposure to nonionizing radiation: Secondary | ICD-10-CM

## 2023-05-05 DIAGNOSIS — Z85820 Personal history of malignant melanoma of skin: Secondary | ICD-10-CM

## 2023-05-05 DIAGNOSIS — Z7189 Other specified counseling: Secondary | ICD-10-CM

## 2023-05-05 DIAGNOSIS — L409 Psoriasis, unspecified: Secondary | ICD-10-CM

## 2023-05-05 DIAGNOSIS — L718 Other rosacea: Secondary | ICD-10-CM | POA: Diagnosis not present

## 2023-05-05 NOTE — Progress Notes (Signed)
Follow-Up Visit   Subjective  Brandon Caldwell is a 52 y.o. male who presents for the following: Psoriasis / rosacea follow up. Currently doing well on treatments.     The following portions of the chart were reviewed this encounter and updated as appropriate: medications, allergies, medical history  Review of Systems:  No other skin or systemic complaints except as noted in HPI or Assessment and Plan.  Objective  Well appearing patient in no apparent distress; mood and affect are within normal limits.  Areas Examined: Hands, legs, arms, face,   Relevant exam findings are noted in the Assessment and Plan.      Assessment & Plan     PSORIASIS Mostly hands  Clear today   0% BSA. On otezla   Chronic condition with duration or expected duration over one year. Currently well-controlled.  On for several year Counseling and coordination of care for severe psoriasis on systemic treatment  Psoriasis - severe on systemic treatment.  Psoriasis is a chronic non-curable, but treatable genetic/hereditary disease that may have other systemic features affecting other organ systems such as joints (Psoriatic Arthritis).  It is linked with heart disease, inflammatory bowel disease, non-alcoholic fatty liver disease, and depression. Significant skin psoriasis and/or psoriatic arthritis may have significant symptoms and affects activities of daily activity and often benefits from systemic treatments.  These systemic treatments have some potential side effects including immunosuppression and require pre-treatment laboratory screening and periodic laboratory monitoring and periodic in person evaluation and monitoring by the attending dermatologist physician (long term medication management).   Denies depression, diarrhea, nausea,    Treatment Plan:  Continue Otezla 30 mg 1 po qd-bid No side effects reported.   Side effects of Otezla (apremilast) include diarrhea, nausea, headache,  upper respiratory infection, depression, and weight decrease (5-10%). It should only be taken by pregnant women after a discussion regarding risks and benefits with their doctor. Goal is control of skin condition, not cure.  The use of [place] requires long term medication management, including periodic office visits.  Reviewed risks of biologics including immunosuppression, infections, injection site reaction, and failure to improve condition. Goal is control of skin condition, not cure.  Some older biologics such as Humira and Enbrel may slightly increase risk of malignancy and may worsen congestive heart failure.  Taltz and Cosentyx may cause inflammatory bowel disease to flare. The use of biologics requires long term medication management, including periodic office visits and monitoring of blood work.   Long term medication management.  Patient is using long term (months to years) prescription medication  to control their dermatologic condition.  These medications require periodic monitoring to evaluate for efficacy and side effects and may require periodic laboratory monitoring.   Rosacea Face With ocular rosacea Rosacea is a chronic progressive skin condition usually affecting the face of adults, causing redness and/or acne bumps. It is treatable but not curable. It sometimes affects the eyes (ocular rosacea) as well. It may respond to topical and/or systemic medication and can flare with stress, sun exposure, alcohol, exercise, topical steroids (including hydrocortisone/cortisone 10) and some foods.  Daily application of broad spectrum spf 30+ sunscreen to face is recommended to reduce flares.   Continue Doxycycline 20 mg 1 po qd with food and plenty of fluid   Doxycycline should be taken with food to prevent nausea. Do not lay down for 30 minutes after taking. Be cautious with sun exposure and use good sun protection while on this medication. Pregnant women should  not take this medication.    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  Chronic and persistent condition with duration or expected duration over one year. Condition is symptomatic/ bothersome to patient. Not currently at goal.  Stasis in the legs causes chronic leg swelling, which may result in itchy or painful rashes, skin discoloration, skin texture changes, and sometimes ulceration.  Recommend daily graduated compression hose/stockings- easiest to put on first thing in morning, remove at bedtime.  Elevate legs as much as possible. Avoid salt/sodium rich foods.  Treatment  Recommend compression hose daily  HISTORY OF MELANOMA Right UQA periumbilical/excision level II, 0.3 cm  - No evidence of recurrence today - No lymphadenopathy - Recommend regular full body skin exams - Recommend daily broad spectrum sunscreen SPF 30+ to sun-exposed areas, reapply every 2 hours as needed.  - Call if any new or changing lesions are noted between office visits     ACTINIC DAMAGE - chronic, secondary to cumulative UV radiation exposure/sun exposure over time - diffuse scaly erythematous macules with underlying dyspigmentation - Recommend daily broad spectrum sunscreen SPF 30+ to sun-exposed areas, reapply every 2 hours as needed.  - Recommend staying in the shade or wearing long sleeves, sun glasses (UVA+UVB protection) and wide brim hats (4-inch brim around the entire circumference of the hat). - Call for new or changing lesions.  Return in about 6 months (around 11/05/2023) for tbse, psoriasis, rosacea .  IAsher Muir, CMA, am acting as scribe for Armida Sans, MD.   Documentation: I have reviewed the above documentation for accuracy and completeness, and I agree with the above.  Armida Sans, MD

## 2023-05-05 NOTE — Patient Instructions (Addendum)
Continue doxycycline 20 tab take 1 by mouth daily with food  Continue otzela    Due to recent changes in healthcare laws, you may see results of your pathology and/or laboratory studies on MyChart before the doctors have had a chance to review them. We understand that in some cases there may be results that are confusing or concerning to you. Please understand that not all results are received at the same time and often the doctors may need to interpret multiple results in order to provide you with the best plan of care or course of treatment. Therefore, we ask that you please give [place] 2 business days to thoroughly review all your results before contacting the office for clarification. Should we see a critical lab result, you will be contacted sooner.   If You Need Anything After Your Visit  If you have any questions or concerns for your doctor, please call our main line at [phone] and press option 4 to reach your doctor's medical assistant. If no one answers, please leave a voicemail as directed and we will return your call as soon as possible. Messages left after 4 pm will be answered the following business day.   You may also send [place] a message via MyChart. We typically respond to MyChart messages within 1-2 business days.  For prescription refills, please ask your pharmacy to contact our office. Our fax number is [phone].  If you have an urgent issue when the clinic is closed that cannot wait until the next business day, you can page your doctor at the number below.    Please note that while we do our best to be available for urgent issues outside of office hours, we are not available 24/7.   If you have an urgent issue and are unable to reach [place], you may choose to seek medical care at your doctor's office, retail clinic, urgent care center, or emergency room.  If you have a medical emergency, please immediately call 911 or go to the emergency department.  Pager Numbers  - Dr.  Gwen Pounds: [phone]  - Dr. Roseanne Reno: [phone]  In the event of inclement weather, please call our main line at [phone] for an update on the status of any delays or closures.  Dermatology Medication Tips: Please keep the boxes that topical medications come in in order to help keep track of the instructions about where and how to use these. Pharmacies typically print the medication instructions only on the boxes and not directly on the medication tubes.   If your medication is too expensive, please contact our office at [phone] option 4 or send [place] a message through MyChart.   We are unable to tell what your co-pay for medications will be in advance as this is different depending on your insurance coverage. However, we may be able to find a substitute medication at lower cost or fill out paperwork to get insurance to cover a needed medication.   If a prior authorization is required to get your medication covered by your insurance company, please allow [place] 1-2 business days to complete this process.  Drug prices often vary depending on where the prescription is filled and some pharmacies may offer cheaper prices.  The website www.goodrx.com contains coupons for medications through different pharmacies. The prices here do not account for what the cost may be with help from insurance (it may be cheaper with your insurance), but the website can give you the price if you did not use any insurance.  -  You can print the associated coupon and take it with your prescription to the pharmacy.  - You may also stop by our office during regular business hours and pick up a GoodRx coupon card.  - If you need your prescription sent electronically to a different pharmacy, notify our office through [facility] or by phone at [phone] option 4.     Si Usted Necesita Algo Despus de Su Visita  Tambin puede enviarnos un mensaje a travs de Clinical cytogeneticist. Por lo general respondemos a los  mensajes de MyChart en el transcurso de 1 a 2 das hbiles.  Para renovar recetas, por favor pida a su farmacia que se ponga en contacto con nuestra oficina. Annie Sable de fax es Ricardo [phone].  Si tiene un asunto urgente cuando la clnica est cerrada y que no puede esperar hasta el siguiente da hbil, puede llamar/localizar a su doctor(a) al nmero que aparece a continuacin.   Por favor, tenga en cuenta que aunque hacemos todo lo posible para estar disponibles para asuntos urgentes fuera del horario de [place], no estamos disponibles las 24 horas del da, los 7 [address]  [address] de la [place].   Si tiene un problema urgente y no puede comunicarse con nosotros, puede optar por buscar atencin mdica  en el consultorio de su doctor(a), en una clnica privada, en un centro de atencin urgente o en una sala de emergencias.  Si tiene Engineer, drilling, por favor llame inmediatamente al 911 o vaya a la sala de emergencias.  Nmeros de bper  - Dr. Gwen Pounds: [phone]  - Dra. Roseanne Reno: [phone]  En caso de inclemencias del [place], por favor llame a Lacy Duverney principal al [phone] para una actualizacin sobre el [place] de cualquier retraso o cierre.  Consejos para la medicacin en dermatologa: Por favor, guarde las cajas en las que vienen los medicamentos de uso tpico para ayudarle a seguir las instrucciones sobre dnde y cmo usarlos. Las farmacias generalmente imprimen las instrucciones del medicamento slo en las cajas y no directamente en los tubos del [place].   Si su medicamento es muy caro, por favor, pngase en contacto con Rolm Gala llamando al [phone] y presione la opcin 4 o envenos un mensaje a travs de Clinical cytogeneticist.   No podemos decirle cul ser su copago por los medicamentos por adelantado ya que esto es diferente dependiendo de la cobertura de su seguro. Sin embargo, es posible que podamos encontrar un medicamento sustituto a Audiological scientist un  formulario para que el seguro cubra el medicamento que se considera necesario.   Si se requiere una autorizacin previa para que su compaa de seguros [place] su medicamento, por favor permtanos de 1 a 2 das hbiles para completar [address]  Los precios de los medicamentos varan con frecuencia dependiendo del Environmental consultant de dnde se surte la receta y alguna farmacias pueden ofrecer precios ms baratos.  El sitio web www.goodrx.com tiene cupones para medicamentos de Health and safety inspector. Los precios aqu no tienen en cuenta lo que podra costar con la ayuda del seguro (puede ser ms barato con su seguro), pero el sitio web puede darle el precio si no utiliz Tourist information centre manager.  - Puede imprimir el cupn correspondiente y llevarlo con su receta a la farmacia.  - Tambin puede pasar por nuestra oficina durante el horario de atencin regular y Education officer, museum una tarjeta de cupones de GoodRx.  - Si necesita que su receta se enve electrnicamente a Psychiatrist, informe a nuestra oficina a travs  de MyChart de [place] o por telfono llamando al [phone] y presione la opcin 4.

## 2023-05-06 ENCOUNTER — Encounter: Payer: Self-pay | Admitting: Internal Medicine

## 2023-05-13 ENCOUNTER — Encounter (HOSPITAL_COMMUNITY): Payer: Self-pay | Admitting: Internal Medicine

## 2023-05-16 ENCOUNTER — Encounter: Payer: Self-pay | Admitting: Dermatology

## 2023-05-16 ENCOUNTER — Other Ambulatory Visit (HOSPITAL_COMMUNITY): Payer: Self-pay | Admitting: Internal Medicine

## 2023-05-16 DIAGNOSIS — E781 Pure hyperglyceridemia: Secondary | ICD-10-CM

## 2023-05-16 DIAGNOSIS — R079 Chest pain, unspecified: Secondary | ICD-10-CM

## 2023-05-20 ENCOUNTER — Telehealth (HOSPITAL_COMMUNITY): Payer: Self-pay | Admitting: *Deleted

## 2023-05-20 MED ORDER — METOPROLOL TARTRATE 100 MG PO TABS: ORAL_TABLET | ORAL | 0 refills | Status: AC

## 2023-05-20 NOTE — Telephone Encounter (Signed)
Reaching out to patient to offer assistance regarding upcoming cardiac imaging study; pt verbalizes understanding of appt date/time, parking situation and where to check in, pre-test NPO status and medications ordered, and verified current allergies; name and call back number provided for further questions should they arise Hayley Sharpe RN Navigator Cardiac Imaging [facility] [phone] office [phone] cell  

## 2023-05-23 ENCOUNTER — Ambulatory Visit
Admission: RE | Admit: 2023-05-23 | Discharge: 2023-05-23 | Disposition: A | Discharge status: Home / Self Care | Payer: BC Managed Care – PPO | Source: Ambulatory Visit | Attending: Internal Medicine | Admitting: Internal Medicine

## 2023-05-23 DIAGNOSIS — E781 Pure hyperglyceridemia: Secondary | ICD-10-CM | POA: Insufficient documentation

## 2023-05-23 DIAGNOSIS — R079 Chest pain, unspecified: Secondary | ICD-10-CM | POA: Insufficient documentation

## 2023-05-23 MED ORDER — NITROGLYCERIN 0.4 MG SL SUBL
0.8000 mg | SUBLINGUAL_TABLET | Freq: Once | SUBLINGUAL | Status: AC
  Administered 2023-05-23: 0.8 mg via SUBLINGUAL

## 2023-05-23 MED ORDER — IOHEXOL 350 MG/ML SOLN
100.0000 mL | Freq: Once | INTRAVENOUS | Status: AC | PRN
  Administered 2023-05-23: 100 mL via INTRAVENOUS

## 2023-05-23 MED ORDER — METOPROLOL TARTRATE 5 MG/5ML IV SOLN: 10.0000 mg | Freq: Once | INTRAVENOUS | Status: DC

## 2023-05-23 NOTE — Progress Notes (Signed)

## 2023-05-26 ENCOUNTER — Ambulatory Visit: Admission: RE | Admit: 2023-05-26 | Payer: BC Managed Care – PPO | Source: Ambulatory Visit

## 2023-05-31 ENCOUNTER — Other Ambulatory Visit: Payer: Self-pay | Admitting: Dermatology

## 2023-05-31 DIAGNOSIS — L719 Rosacea, unspecified: Secondary | ICD-10-CM

## 2023-11-09 ENCOUNTER — Ambulatory Visit: Payer: BC Managed Care – PPO | Admitting: Dermatology

## 2023-11-15 ENCOUNTER — Other Ambulatory Visit: Payer: Self-pay | Admitting: Dermatology

## 2023-11-15 DIAGNOSIS — L719 Rosacea, unspecified: Secondary | ICD-10-CM

## 2023-11-24 ENCOUNTER — Ambulatory Visit: Payer: BC Managed Care – PPO | Admitting: Dermatology

## 2024-02-02 ENCOUNTER — Ambulatory Visit: Payer: BC Managed Care – PPO | Admitting: Dermatology

## 2024-02-06 ENCOUNTER — Encounter: Payer: Self-pay | Admitting: Dermatology

## 2024-02-06 ENCOUNTER — Ambulatory Visit: Admitting: Dermatology

## 2024-02-06 DIAGNOSIS — L578 Other skin changes due to chronic exposure to nonionizing radiation: Secondary | ICD-10-CM | POA: Diagnosis not present

## 2024-02-06 DIAGNOSIS — L817 Pigmented purpuric dermatosis: Secondary | ICD-10-CM

## 2024-02-06 DIAGNOSIS — L814 Other melanin hyperpigmentation: Secondary | ICD-10-CM | POA: Diagnosis not present

## 2024-02-06 DIAGNOSIS — Z1283 Encounter for screening for malignant neoplasm of skin: Secondary | ICD-10-CM | POA: Diagnosis not present

## 2024-02-06 DIAGNOSIS — I872 Venous insufficiency (chronic) (peripheral): Secondary | ICD-10-CM

## 2024-02-06 DIAGNOSIS — Z85820 Personal history of malignant melanoma of skin: Secondary | ICD-10-CM

## 2024-02-06 DIAGNOSIS — Z7189 Other specified counseling: Secondary | ICD-10-CM

## 2024-02-06 DIAGNOSIS — Z79899 Other long term (current) drug therapy: Secondary | ICD-10-CM

## 2024-02-06 DIAGNOSIS — L409 Psoriasis, unspecified: Secondary | ICD-10-CM

## 2024-02-06 DIAGNOSIS — D229 Melanocytic nevi, unspecified: Secondary | ICD-10-CM

## 2024-02-06 MED ORDER — OTEZLA 30 MG PO TABS
1.0000 | ORAL_TABLET | Freq: Two times a day (BID) | ORAL | 0 refills | Status: DC
Start: 2024-02-06 — End: 2024-02-14

## 2024-02-06 NOTE — Patient Instructions (Addendum)

## 2024-02-06 NOTE — Progress Notes (Signed)
 Follow-Up Visit   Subjective  Brandon Caldwell is a 53 y.o. male who presents for the following: Skin Cancer Screening and Full Body Skin Exam Psoriasis on otezla  , staying controlled , history of flares at hands   The patient presents for Total-Body Skin Exam (TBSE) for skin cancer screening and mole check. The patient has spots, moles and lesions to be evaluated, some may be new or changing and the patient may have concern these could be cancer.  The following portions of the chart were reviewed this encounter and updated as appropriate: medications, allergies, medical history  Review of Systems:  No other skin or systemic complaints except as noted in HPI or Assessment and Plan.  Objective  Well appearing patient in no apparent distress; mood and affect are within normal limits.  A full examination was performed including scalp, head, eyes, ears, nose, lips, neck, chest, axillae, abdomen, back, buttocks, bilateral upper extremities, bilateral lower extremities, hands, feet, fingers, toes, fingernails, and toenails. All findings within normal limits unless otherwise noted below.   Relevant physical exam findings are noted in the Assessment and Plan.   Assessment & Plan   SKIN CANCER SCREENING PERFORMED TODAY.  ACTINIC DAMAGE - Chronic condition, secondary to cumulative UV/sun exposure - diffuse scaly erythematous macules with underlying dyspigmentation - Recommend daily broad spectrum sunscreen SPF 30+ to sun-exposed areas, reapply every 2 hours as needed.  - Staying in the shade or wearing long sleeves, sun glasses (UVA+UVB protection) and wide brim hats (4-inch brim around the entire circumference of the hat) are also recommended for sun protection.  - Call for new or changing lesions.  LENTIGINES, SEBORRHEIC KERATOSES, HEMANGIOMAS - Benign normal skin lesions - Benign-appearing - Call for any changes  MELANOCYTIC NEVI - Tan-brown and/or pink-flesh-colored symmetric  macules and papules - Benign appearing on exam today - Observation - Call clinic for new or changing moles - Recommend daily use of broad spectrum spf 30+ sunscreen to sun-exposed areas.   HISTORY OF MELANOMA Right UQA periumbilical/excision level II, 0.3 cm (2018) - No evidence of recurrence today - No lymphadenopathy - Recommend regular full body skin exams - Recommend daily broad spectrum sunscreen SPF 30+ to sun-exposed areas, reapply every 2 hours as needed.  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 Chronic and persistent condition with duration or expected duration over one year. Condition is symptomatic/ bothersome to patient. Not currently at goal. Stasis in the legs causes chronic leg swelling, which may result in itchy or painful rashes, skin discoloration, skin texture changes, and sometimes ulceration.  Recommend daily graduated compression hose/stockings- easiest to put on first thing in morning, remove at bedtime.  Elevate legs as much as possible. Avoid salt/sodium rich foods. Treatment  Recommend compression hose daily  PSORIASIS on systemic treatment Clear today. 0% BSA on otezla   Chronic condition with duration or expected duration over one year. Currently well-controlled. Counseling and coordination of care for severe psoriasis on systemic treatment  Psoriasis - severe on systemic treatment.  Psoriasis is a chronic non-curable, but treatable genetic/hereditary disease that may have other systemic features affecting other organ systems such as joints (Psoriatic Arthritis).  It is linked with heart disease, inflammatory bowel disease, non-alcoholic fatty liver disease, and depression. Significant skin psoriasis and/or psoriatic arthritis may have significant symptoms and affects  activities of daily activity and often benefits from systemic treatments.  These systemic treatments have some potential side effects including immunosuppression and require pre-treatment laboratory screening and periodic laboratory monitoring and periodic in person evaluation and monitoring by the attending dermatologist physician (long term medication management).   Denies depression, diarrhea, nausea, headaches Patient denies joint pain  Treatment Plan: Continue Otezla  30 mg 1 po qd-bid No side effects reported.  Reviewed risks of biologics including immunosuppression, infections, injection site reaction, and failure to improve condition. Goal is control of skin condition, not cure.  Some older biologics such as Humira and Enbrel may slightly increase risk of malignancy and may worsen congestive heart failure.  Taltz and Cosentyx may cause inflammatory bowel disease to flare. The use of biologics requires long term medication management, including periodic office visits and monitoring of blood work.   Long term medication management.  Patient is using long term (months to years) prescription medication  to control their dermatologic condition.  These medications require periodic monitoring to evaluate for efficacy and side effects and may require periodic laboratory monitoring.   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 Chronic and persistent condition with duration or expected duration over one year. Condition is symptomatic/ bothersome to patient. Not currently at goal. Stasis in the legs causes chronic leg swelling, which may result in itchy or painful rashes, skin discoloration, skin texture changes, and sometimes ulceration.  Recommend daily graduated compression hose/stockings- easiest to put on first thing in morning, remove at bedtime.  Elevate legs as much as possible.  Avoid salt/sodium rich foods. Treatment  Recommend compression hose daily  SKIN CANCER SCREENING   PSORIASIS   Related Medications Apremilast  (OTEZLA ) 30 MG TABS Take 1 tablet (30 mg total) by mouth 2 (two) times daily. ACTINIC SKIN DAMAGE   LENTIGO   MELANOCYTIC NEVUS, UNSPECIFIED LOCATION   HISTORY OF MALIGNANT MELANOMA   SCHAMBERG'S PURPURA   STASIS DERMATITIS OF BOTH LEGS   LONG-TERM USE OF HIGH-RISK MEDICATION   COUNSELING AND COORDINATION OF CARE   MEDICATION MANAGEMENT   Return in about 6 months (around 08/08/2024) for psoriasis follow up, 1 year tbse .  IRandee Busing, CMA, am acting as scribe for Celine Collard, MD.   Documentation: I have reviewed the above documentation for accuracy and completeness, and I agree with the above.  Celine Collard, MD

## 2024-02-14 ENCOUNTER — Other Ambulatory Visit: Payer: Self-pay

## 2024-02-14 DIAGNOSIS — L409 Psoriasis, unspecified: Secondary | ICD-10-CM

## 2024-02-14 MED ORDER — OTEZLA 30 MG PO TABS
1.0000 | ORAL_TABLET | Freq: Two times a day (BID) | ORAL | 5 refills | Status: DC
Start: 2024-02-14 — End: 2024-02-21

## 2024-02-14 NOTE — Progress Notes (Signed)
 No information with pharmacy.  Otezla  RX re sent. aw

## 2024-02-21 ENCOUNTER — Other Ambulatory Visit: Payer: Self-pay

## 2024-02-21 MED ORDER — OTEZLA 30 MG PO TABS: 30.0000 mg | ORAL_TABLET | Freq: Two times a day (BID) | ORAL | 1 refills | Status: DC

## 2024-02-21 NOTE — Progress Notes (Signed)
 Fax received from [facility] that patient is requesting 90 days supply.

## 2024-05-10 ENCOUNTER — Other Ambulatory Visit: Payer: Self-pay | Admitting: Dermatology

## 2024-05-10 DIAGNOSIS — L719 Rosacea, unspecified: Secondary | ICD-10-CM

## 2024-08-01 ENCOUNTER — Other Ambulatory Visit: Payer: Self-pay | Admitting: Dermatology

## 2024-08-13 ENCOUNTER — Ambulatory Visit: Admitting: Dermatology

## 2024-09-13 ENCOUNTER — Other Ambulatory Visit: Payer: Self-pay | Admitting: Dermatology

## 2025-02-12 ENCOUNTER — Ambulatory Visit: Admitting: Dermatology

## 2025-02-25 ENCOUNTER — Ambulatory Visit: Admitting: Dermatology
